# Patient Record
Sex: Male | Born: 1940
Health system: Southern US, Community
[De-identification: ages and names within clinical notes are randomized; demographics above are authoritative.]

## PROBLEM LIST (undated history)

## (undated) DIAGNOSIS — G4700 Insomnia, unspecified: Secondary | ICD-10-CM

## (undated) DIAGNOSIS — E785 Hyperlipidemia, unspecified: Secondary | ICD-10-CM

## (undated) DIAGNOSIS — J45909 Unspecified asthma, uncomplicated: Secondary | ICD-10-CM

## (undated) DIAGNOSIS — J309 Allergic rhinitis, unspecified: Secondary | ICD-10-CM

## (undated) DIAGNOSIS — F0390 Unspecified dementia without behavioral disturbance: Secondary | ICD-10-CM

## (undated) DIAGNOSIS — Z833 Family history of diabetes mellitus: Secondary | ICD-10-CM

## (undated) DIAGNOSIS — J449 Chronic obstructive pulmonary disease, unspecified: Secondary | ICD-10-CM

## (undated) DIAGNOSIS — K219 Gastro-esophageal reflux disease without esophagitis: Secondary | ICD-10-CM

## (undated) DIAGNOSIS — Z6833 Body mass index (BMI) 33.0-33.9, adult: Secondary | ICD-10-CM

## (undated) DIAGNOSIS — F32A Depression, unspecified: Secondary | ICD-10-CM

## (undated) DIAGNOSIS — K5909 Other constipation: Secondary | ICD-10-CM

## (undated) DIAGNOSIS — R600 Localized edema: Secondary | ICD-10-CM

## (undated) DIAGNOSIS — I712 Thoracic aortic aneurysm, without rupture, unspecified: Secondary | ICD-10-CM

## (undated) DIAGNOSIS — E669 Obesity, unspecified: Secondary | ICD-10-CM

## (undated) DIAGNOSIS — F320 Major depressive disorder, single episode, mild: Secondary | ICD-10-CM

## (undated) DIAGNOSIS — R079 Chest pain, unspecified: Secondary | ICD-10-CM

## (undated) DIAGNOSIS — I700 Atherosclerosis of aorta: Secondary | ICD-10-CM

## (undated) DIAGNOSIS — I10 Essential (primary) hypertension: Secondary | ICD-10-CM

## (undated) DIAGNOSIS — R413 Other amnesia: Secondary | ICD-10-CM

## (undated) DIAGNOSIS — M25561 Pain in right knee: Secondary | ICD-10-CM

## (undated) DIAGNOSIS — E559 Vitamin D deficiency, unspecified: Secondary | ICD-10-CM

## (undated) DIAGNOSIS — R5383 Other fatigue: Secondary | ICD-10-CM

## (undated) HISTORY — DX: Unspecified asthma, uncomplicated: J45.909

## (undated) HISTORY — DX: Allergic rhinitis, unspecified: J30.9

## (undated) HISTORY — DX: Family history of diabetes mellitus: Z83.3

## (undated) HISTORY — DX: Hyperlipidemia, unspecified: E78.5

## (undated) HISTORY — DX: Body mass index (BMI) 33.0-33.9, adult: Z68.33

## (undated) HISTORY — DX: Pain in right knee: M25.561

## (undated) HISTORY — DX: Atherosclerosis of aorta: I70.0

## (undated) HISTORY — PX: NO PAST SURGERIES: SHX2092

## (undated) HISTORY — DX: Other constipation: K59.09

## (undated) HISTORY — DX: Insomnia, unspecified: G47.00

## (undated) HISTORY — DX: Other amnesia: R41.3

## (undated) HISTORY — DX: Vitamin D deficiency, unspecified: E55.9

## (undated) HISTORY — DX: Major depressive disorder, single episode, mild: F32.0

## (undated) HISTORY — DX: Other fatigue: R53.83

## (undated) HISTORY — DX: Obesity, unspecified: E66.9

## (undated) HISTORY — DX: Depression, unspecified: F32.A

## (undated) HISTORY — DX: Localized edema: R60.0

## (undated) HISTORY — DX: Thoracic aortic aneurysm, without rupture, unspecified: I71.20

## (undated) HISTORY — DX: Chronic obstructive pulmonary disease, unspecified: J44.9

## (undated) HISTORY — DX: Chest pain, unspecified: R07.9

## (undated) HISTORY — DX: Thoracic aortic aneurysm, without rupture: I71.2

---

## 2014-06-11 DIAGNOSIS — H43393 Other vitreous opacities, bilateral: Secondary | ICD-10-CM | POA: Diagnosis not present

## 2014-06-11 DIAGNOSIS — H25813 Combined forms of age-related cataract, bilateral: Secondary | ICD-10-CM | POA: Diagnosis not present

## 2014-06-11 DIAGNOSIS — H11153 Pinguecula, bilateral: Secondary | ICD-10-CM | POA: Diagnosis not present

## 2014-08-23 DIAGNOSIS — H25812 Combined forms of age-related cataract, left eye: Secondary | ICD-10-CM | POA: Diagnosis not present

## 2014-08-23 DIAGNOSIS — H40013 Open angle with borderline findings, low risk, bilateral: Secondary | ICD-10-CM | POA: Diagnosis not present

## 2014-09-03 DIAGNOSIS — H259 Unspecified age-related cataract: Secondary | ICD-10-CM | POA: Diagnosis not present

## 2014-09-03 DIAGNOSIS — Z87891 Personal history of nicotine dependence: Secondary | ICD-10-CM | POA: Diagnosis not present

## 2014-09-03 DIAGNOSIS — H25812 Combined forms of age-related cataract, left eye: Secondary | ICD-10-CM | POA: Diagnosis not present

## 2014-09-03 DIAGNOSIS — H25811 Combined forms of age-related cataract, right eye: Secondary | ICD-10-CM | POA: Diagnosis not present

## 2014-10-18 DIAGNOSIS — E782 Mixed hyperlipidemia: Secondary | ICD-10-CM | POA: Diagnosis not present

## 2014-10-18 DIAGNOSIS — Z1389 Encounter for screening for other disorder: Secondary | ICD-10-CM | POA: Diagnosis not present

## 2014-10-18 DIAGNOSIS — E039 Hypothyroidism, unspecified: Secondary | ICD-10-CM | POA: Diagnosis not present

## 2014-10-18 DIAGNOSIS — Z79899 Other long term (current) drug therapy: Secondary | ICD-10-CM | POA: Diagnosis not present

## 2014-10-18 DIAGNOSIS — L409 Psoriasis, unspecified: Secondary | ICD-10-CM | POA: Diagnosis not present

## 2014-10-18 DIAGNOSIS — Z23 Encounter for immunization: Secondary | ICD-10-CM | POA: Diagnosis not present

## 2014-10-18 DIAGNOSIS — Z125 Encounter for screening for malignant neoplasm of prostate: Secondary | ICD-10-CM | POA: Diagnosis not present

## 2014-10-22 DIAGNOSIS — J449 Chronic obstructive pulmonary disease, unspecified: Secondary | ICD-10-CM | POA: Diagnosis not present

## 2014-10-22 DIAGNOSIS — Z87891 Personal history of nicotine dependence: Secondary | ICD-10-CM | POA: Diagnosis not present

## 2014-10-22 DIAGNOSIS — H25811 Combined forms of age-related cataract, right eye: Secondary | ICD-10-CM | POA: Diagnosis not present

## 2014-10-22 DIAGNOSIS — Z79899 Other long term (current) drug therapy: Secondary | ICD-10-CM | POA: Diagnosis not present

## 2014-10-30 DIAGNOSIS — M1711 Unilateral primary osteoarthritis, right knee: Secondary | ICD-10-CM | POA: Diagnosis not present

## 2014-10-30 DIAGNOSIS — Z01818 Encounter for other preprocedural examination: Secondary | ICD-10-CM | POA: Diagnosis not present

## 2014-10-30 DIAGNOSIS — Z79899 Other long term (current) drug therapy: Secondary | ICD-10-CM | POA: Diagnosis not present

## 2014-10-30 DIAGNOSIS — Z01810 Encounter for preprocedural cardiovascular examination: Secondary | ICD-10-CM | POA: Diagnosis not present

## 2014-10-30 DIAGNOSIS — M79609 Pain in unspecified limb: Secondary | ICD-10-CM | POA: Diagnosis not present

## 2014-11-21 DIAGNOSIS — Z01810 Encounter for preprocedural cardiovascular examination: Secondary | ICD-10-CM | POA: Diagnosis not present

## 2014-11-21 DIAGNOSIS — R9431 Abnormal electrocardiogram [ECG] [EKG]: Secondary | ICD-10-CM | POA: Diagnosis not present

## 2014-12-05 DIAGNOSIS — M1711 Unilateral primary osteoarthritis, right knee: Secondary | ICD-10-CM | POA: Diagnosis not present

## 2014-12-17 DIAGNOSIS — Z471 Aftercare following joint replacement surgery: Secondary | ICD-10-CM | POA: Diagnosis not present

## 2014-12-17 DIAGNOSIS — E7800 Pure hypercholesterolemia, unspecified: Secondary | ICD-10-CM | POA: Diagnosis not present

## 2014-12-17 DIAGNOSIS — E669 Obesity, unspecified: Secondary | ICD-10-CM | POA: Diagnosis not present

## 2014-12-17 DIAGNOSIS — J449 Chronic obstructive pulmonary disease, unspecified: Secondary | ICD-10-CM | POA: Diagnosis not present

## 2014-12-17 DIAGNOSIS — Z96651 Presence of right artificial knee joint: Secondary | ICD-10-CM | POA: Diagnosis not present

## 2014-12-17 DIAGNOSIS — M1990 Unspecified osteoarthritis, unspecified site: Secondary | ICD-10-CM | POA: Diagnosis not present

## 2014-12-17 DIAGNOSIS — J45909 Unspecified asthma, uncomplicated: Secondary | ICD-10-CM | POA: Diagnosis not present

## 2014-12-17 DIAGNOSIS — G479 Sleep disorder, unspecified: Secondary | ICD-10-CM | POA: Diagnosis not present

## 2014-12-17 DIAGNOSIS — M1711 Unilateral primary osteoarthritis, right knee: Secondary | ICD-10-CM | POA: Diagnosis not present

## 2014-12-17 DIAGNOSIS — J4520 Mild intermittent asthma, uncomplicated: Secondary | ICD-10-CM | POA: Diagnosis not present

## 2014-12-17 DIAGNOSIS — M25561 Pain in right knee: Secondary | ICD-10-CM | POA: Diagnosis not present

## 2014-12-17 DIAGNOSIS — G8918 Other acute postprocedural pain: Secondary | ICD-10-CM | POA: Diagnosis not present

## 2014-12-17 DIAGNOSIS — E039 Hypothyroidism, unspecified: Secondary | ICD-10-CM | POA: Diagnosis not present

## 2014-12-17 DIAGNOSIS — K219 Gastro-esophageal reflux disease without esophagitis: Secondary | ICD-10-CM | POA: Diagnosis not present

## 2014-12-17 DIAGNOSIS — I10 Essential (primary) hypertension: Secondary | ICD-10-CM | POA: Diagnosis not present

## 2014-12-21 DIAGNOSIS — J45909 Unspecified asthma, uncomplicated: Secondary | ICD-10-CM | POA: Diagnosis not present

## 2014-12-21 DIAGNOSIS — I10 Essential (primary) hypertension: Secondary | ICD-10-CM | POA: Diagnosis not present

## 2014-12-21 DIAGNOSIS — E669 Obesity, unspecified: Secondary | ICD-10-CM | POA: Diagnosis not present

## 2014-12-21 DIAGNOSIS — Z96651 Presence of right artificial knee joint: Secondary | ICD-10-CM | POA: Diagnosis not present

## 2014-12-21 DIAGNOSIS — Z471 Aftercare following joint replacement surgery: Secondary | ICD-10-CM | POA: Diagnosis not present

## 2014-12-21 DIAGNOSIS — G8918 Other acute postprocedural pain: Secondary | ICD-10-CM | POA: Diagnosis not present

## 2014-12-21 DIAGNOSIS — Z6830 Body mass index (BMI) 30.0-30.9, adult: Secondary | ICD-10-CM | POA: Diagnosis not present

## 2014-12-21 DIAGNOSIS — M150 Primary generalized (osteo)arthritis: Secondary | ICD-10-CM | POA: Diagnosis not present

## 2014-12-22 DIAGNOSIS — Z471 Aftercare following joint replacement surgery: Secondary | ICD-10-CM | POA: Diagnosis not present

## 2014-12-22 DIAGNOSIS — J45909 Unspecified asthma, uncomplicated: Secondary | ICD-10-CM | POA: Diagnosis not present

## 2014-12-22 DIAGNOSIS — E669 Obesity, unspecified: Secondary | ICD-10-CM | POA: Diagnosis not present

## 2014-12-22 DIAGNOSIS — Z96651 Presence of right artificial knee joint: Secondary | ICD-10-CM | POA: Diagnosis not present

## 2014-12-22 DIAGNOSIS — Z6830 Body mass index (BMI) 30.0-30.9, adult: Secondary | ICD-10-CM | POA: Diagnosis not present

## 2014-12-22 DIAGNOSIS — G8918 Other acute postprocedural pain: Secondary | ICD-10-CM | POA: Diagnosis not present

## 2014-12-22 DIAGNOSIS — I10 Essential (primary) hypertension: Secondary | ICD-10-CM | POA: Diagnosis not present

## 2014-12-22 DIAGNOSIS — M150 Primary generalized (osteo)arthritis: Secondary | ICD-10-CM | POA: Diagnosis not present

## 2014-12-23 DIAGNOSIS — J45909 Unspecified asthma, uncomplicated: Secondary | ICD-10-CM | POA: Diagnosis not present

## 2014-12-23 DIAGNOSIS — Z471 Aftercare following joint replacement surgery: Secondary | ICD-10-CM | POA: Diagnosis not present

## 2014-12-23 DIAGNOSIS — M150 Primary generalized (osteo)arthritis: Secondary | ICD-10-CM | POA: Diagnosis not present

## 2014-12-23 DIAGNOSIS — I10 Essential (primary) hypertension: Secondary | ICD-10-CM | POA: Diagnosis not present

## 2014-12-23 DIAGNOSIS — E669 Obesity, unspecified: Secondary | ICD-10-CM | POA: Diagnosis not present

## 2014-12-23 DIAGNOSIS — Z6830 Body mass index (BMI) 30.0-30.9, adult: Secondary | ICD-10-CM | POA: Diagnosis not present

## 2014-12-23 DIAGNOSIS — G8918 Other acute postprocedural pain: Secondary | ICD-10-CM | POA: Diagnosis not present

## 2014-12-23 DIAGNOSIS — Z96651 Presence of right artificial knee joint: Secondary | ICD-10-CM | POA: Diagnosis not present

## 2014-12-24 DIAGNOSIS — G8918 Other acute postprocedural pain: Secondary | ICD-10-CM | POA: Diagnosis not present

## 2014-12-24 DIAGNOSIS — Z6830 Body mass index (BMI) 30.0-30.9, adult: Secondary | ICD-10-CM | POA: Diagnosis not present

## 2014-12-24 DIAGNOSIS — J22 Unspecified acute lower respiratory infection: Secondary | ICD-10-CM | POA: Diagnosis not present

## 2014-12-24 DIAGNOSIS — Z471 Aftercare following joint replacement surgery: Secondary | ICD-10-CM | POA: Diagnosis not present

## 2014-12-24 DIAGNOSIS — M150 Primary generalized (osteo)arthritis: Secondary | ICD-10-CM | POA: Diagnosis not present

## 2014-12-24 DIAGNOSIS — I10 Essential (primary) hypertension: Secondary | ICD-10-CM | POA: Diagnosis not present

## 2014-12-24 DIAGNOSIS — J45909 Unspecified asthma, uncomplicated: Secondary | ICD-10-CM | POA: Diagnosis not present

## 2014-12-24 DIAGNOSIS — Z96651 Presence of right artificial knee joint: Secondary | ICD-10-CM | POA: Diagnosis not present

## 2014-12-24 DIAGNOSIS — E669 Obesity, unspecified: Secondary | ICD-10-CM | POA: Diagnosis not present

## 2014-12-25 DIAGNOSIS — M150 Primary generalized (osteo)arthritis: Secondary | ICD-10-CM | POA: Diagnosis not present

## 2014-12-25 DIAGNOSIS — Z96651 Presence of right artificial knee joint: Secondary | ICD-10-CM | POA: Diagnosis not present

## 2014-12-25 DIAGNOSIS — G8918 Other acute postprocedural pain: Secondary | ICD-10-CM | POA: Diagnosis not present

## 2014-12-25 DIAGNOSIS — J45909 Unspecified asthma, uncomplicated: Secondary | ICD-10-CM | POA: Diagnosis not present

## 2014-12-25 DIAGNOSIS — E669 Obesity, unspecified: Secondary | ICD-10-CM | POA: Diagnosis not present

## 2014-12-25 DIAGNOSIS — Z471 Aftercare following joint replacement surgery: Secondary | ICD-10-CM | POA: Diagnosis not present

## 2014-12-25 DIAGNOSIS — I10 Essential (primary) hypertension: Secondary | ICD-10-CM | POA: Diagnosis not present

## 2014-12-25 DIAGNOSIS — Z6830 Body mass index (BMI) 30.0-30.9, adult: Secondary | ICD-10-CM | POA: Diagnosis not present

## 2014-12-26 DIAGNOSIS — M150 Primary generalized (osteo)arthritis: Secondary | ICD-10-CM | POA: Diagnosis not present

## 2014-12-26 DIAGNOSIS — Z471 Aftercare following joint replacement surgery: Secondary | ICD-10-CM | POA: Diagnosis not present

## 2014-12-26 DIAGNOSIS — G8918 Other acute postprocedural pain: Secondary | ICD-10-CM | POA: Diagnosis not present

## 2014-12-26 DIAGNOSIS — I10 Essential (primary) hypertension: Secondary | ICD-10-CM | POA: Diagnosis not present

## 2014-12-26 DIAGNOSIS — Z6830 Body mass index (BMI) 30.0-30.9, adult: Secondary | ICD-10-CM | POA: Diagnosis not present

## 2014-12-26 DIAGNOSIS — Z96651 Presence of right artificial knee joint: Secondary | ICD-10-CM | POA: Diagnosis not present

## 2014-12-26 DIAGNOSIS — J45909 Unspecified asthma, uncomplicated: Secondary | ICD-10-CM | POA: Diagnosis not present

## 2014-12-26 DIAGNOSIS — E669 Obesity, unspecified: Secondary | ICD-10-CM | POA: Diagnosis not present

## 2014-12-27 DIAGNOSIS — J45909 Unspecified asthma, uncomplicated: Secondary | ICD-10-CM | POA: Diagnosis not present

## 2014-12-27 DIAGNOSIS — Z471 Aftercare following joint replacement surgery: Secondary | ICD-10-CM | POA: Diagnosis not present

## 2014-12-27 DIAGNOSIS — Z6830 Body mass index (BMI) 30.0-30.9, adult: Secondary | ICD-10-CM | POA: Diagnosis not present

## 2014-12-27 DIAGNOSIS — Z96651 Presence of right artificial knee joint: Secondary | ICD-10-CM | POA: Diagnosis not present

## 2014-12-27 DIAGNOSIS — G8918 Other acute postprocedural pain: Secondary | ICD-10-CM | POA: Diagnosis not present

## 2014-12-27 DIAGNOSIS — M150 Primary generalized (osteo)arthritis: Secondary | ICD-10-CM | POA: Diagnosis not present

## 2014-12-27 DIAGNOSIS — I10 Essential (primary) hypertension: Secondary | ICD-10-CM | POA: Diagnosis not present

## 2014-12-27 DIAGNOSIS — E669 Obesity, unspecified: Secondary | ICD-10-CM | POA: Diagnosis not present

## 2014-12-31 DIAGNOSIS — M25561 Pain in right knee: Secondary | ICD-10-CM | POA: Diagnosis not present

## 2014-12-31 DIAGNOSIS — R262 Difficulty in walking, not elsewhere classified: Secondary | ICD-10-CM | POA: Diagnosis not present

## 2014-12-31 DIAGNOSIS — Z96651 Presence of right artificial knee joint: Secondary | ICD-10-CM | POA: Diagnosis not present

## 2014-12-31 DIAGNOSIS — Z471 Aftercare following joint replacement surgery: Secondary | ICD-10-CM | POA: Diagnosis not present

## 2015-01-07 DIAGNOSIS — Z96651 Presence of right artificial knee joint: Secondary | ICD-10-CM | POA: Diagnosis not present

## 2015-01-07 DIAGNOSIS — M25561 Pain in right knee: Secondary | ICD-10-CM | POA: Diagnosis not present

## 2015-01-07 DIAGNOSIS — R262 Difficulty in walking, not elsewhere classified: Secondary | ICD-10-CM | POA: Diagnosis not present

## 2015-01-07 DIAGNOSIS — Z471 Aftercare following joint replacement surgery: Secondary | ICD-10-CM | POA: Diagnosis not present

## 2015-01-10 DIAGNOSIS — R262 Difficulty in walking, not elsewhere classified: Secondary | ICD-10-CM | POA: Diagnosis not present

## 2015-01-10 DIAGNOSIS — Z471 Aftercare following joint replacement surgery: Secondary | ICD-10-CM | POA: Diagnosis not present

## 2015-01-10 DIAGNOSIS — M25561 Pain in right knee: Secondary | ICD-10-CM | POA: Diagnosis not present

## 2015-01-10 DIAGNOSIS — Z96651 Presence of right artificial knee joint: Secondary | ICD-10-CM | POA: Diagnosis not present

## 2015-01-14 DIAGNOSIS — Z471 Aftercare following joint replacement surgery: Secondary | ICD-10-CM | POA: Diagnosis not present

## 2015-01-14 DIAGNOSIS — R262 Difficulty in walking, not elsewhere classified: Secondary | ICD-10-CM | POA: Diagnosis not present

## 2015-01-14 DIAGNOSIS — M25561 Pain in right knee: Secondary | ICD-10-CM | POA: Diagnosis not present

## 2015-01-14 DIAGNOSIS — Z96651 Presence of right artificial knee joint: Secondary | ICD-10-CM | POA: Diagnosis not present

## 2015-01-16 DIAGNOSIS — Z96651 Presence of right artificial knee joint: Secondary | ICD-10-CM | POA: Diagnosis not present

## 2015-01-16 DIAGNOSIS — M25561 Pain in right knee: Secondary | ICD-10-CM | POA: Diagnosis not present

## 2015-01-16 DIAGNOSIS — R262 Difficulty in walking, not elsewhere classified: Secondary | ICD-10-CM | POA: Diagnosis not present

## 2015-01-16 DIAGNOSIS — Z471 Aftercare following joint replacement surgery: Secondary | ICD-10-CM | POA: Diagnosis not present

## 2015-01-21 DIAGNOSIS — R262 Difficulty in walking, not elsewhere classified: Secondary | ICD-10-CM | POA: Diagnosis not present

## 2015-01-21 DIAGNOSIS — M25561 Pain in right knee: Secondary | ICD-10-CM | POA: Diagnosis not present

## 2015-01-21 DIAGNOSIS — Z96651 Presence of right artificial knee joint: Secondary | ICD-10-CM | POA: Diagnosis not present

## 2015-01-21 DIAGNOSIS — Z471 Aftercare following joint replacement surgery: Secondary | ICD-10-CM | POA: Diagnosis not present

## 2015-01-23 DIAGNOSIS — Z96651 Presence of right artificial knee joint: Secondary | ICD-10-CM | POA: Diagnosis not present

## 2015-01-23 DIAGNOSIS — Z471 Aftercare following joint replacement surgery: Secondary | ICD-10-CM | POA: Diagnosis not present

## 2015-01-23 DIAGNOSIS — R262 Difficulty in walking, not elsewhere classified: Secondary | ICD-10-CM | POA: Diagnosis not present

## 2015-01-23 DIAGNOSIS — M25561 Pain in right knee: Secondary | ICD-10-CM | POA: Diagnosis not present

## 2015-01-27 DIAGNOSIS — Z471 Aftercare following joint replacement surgery: Secondary | ICD-10-CM | POA: Diagnosis not present

## 2015-01-27 DIAGNOSIS — M25561 Pain in right knee: Secondary | ICD-10-CM | POA: Diagnosis not present

## 2015-01-27 DIAGNOSIS — M1711 Unilateral primary osteoarthritis, right knee: Secondary | ICD-10-CM | POA: Diagnosis not present

## 2015-01-27 DIAGNOSIS — Z96651 Presence of right artificial knee joint: Secondary | ICD-10-CM | POA: Diagnosis not present

## 2015-01-27 DIAGNOSIS — R262 Difficulty in walking, not elsewhere classified: Secondary | ICD-10-CM | POA: Diagnosis not present

## 2015-01-30 DIAGNOSIS — M25561 Pain in right knee: Secondary | ICD-10-CM | POA: Diagnosis not present

## 2015-01-30 DIAGNOSIS — R262 Difficulty in walking, not elsewhere classified: Secondary | ICD-10-CM | POA: Diagnosis not present

## 2015-01-30 DIAGNOSIS — Z96651 Presence of right artificial knee joint: Secondary | ICD-10-CM | POA: Diagnosis not present

## 2015-01-30 DIAGNOSIS — Z471 Aftercare following joint replacement surgery: Secondary | ICD-10-CM | POA: Diagnosis not present

## 2015-02-04 DIAGNOSIS — M25561 Pain in right knee: Secondary | ICD-10-CM | POA: Diagnosis not present

## 2015-02-04 DIAGNOSIS — Z96651 Presence of right artificial knee joint: Secondary | ICD-10-CM | POA: Diagnosis not present

## 2015-02-04 DIAGNOSIS — Z471 Aftercare following joint replacement surgery: Secondary | ICD-10-CM | POA: Diagnosis not present

## 2015-02-04 DIAGNOSIS — R262 Difficulty in walking, not elsewhere classified: Secondary | ICD-10-CM | POA: Diagnosis not present

## 2015-02-06 DIAGNOSIS — M25561 Pain in right knee: Secondary | ICD-10-CM | POA: Diagnosis not present

## 2015-02-06 DIAGNOSIS — Z96651 Presence of right artificial knee joint: Secondary | ICD-10-CM | POA: Diagnosis not present

## 2015-02-06 DIAGNOSIS — Z471 Aftercare following joint replacement surgery: Secondary | ICD-10-CM | POA: Diagnosis not present

## 2015-02-06 DIAGNOSIS — R262 Difficulty in walking, not elsewhere classified: Secondary | ICD-10-CM | POA: Diagnosis not present

## 2015-02-11 DIAGNOSIS — M25561 Pain in right knee: Secondary | ICD-10-CM | POA: Diagnosis not present

## 2015-02-11 DIAGNOSIS — Z471 Aftercare following joint replacement surgery: Secondary | ICD-10-CM | POA: Diagnosis not present

## 2015-02-11 DIAGNOSIS — Z96651 Presence of right artificial knee joint: Secondary | ICD-10-CM | POA: Diagnosis not present

## 2015-02-11 DIAGNOSIS — R262 Difficulty in walking, not elsewhere classified: Secondary | ICD-10-CM | POA: Diagnosis not present

## 2015-02-13 DIAGNOSIS — R262 Difficulty in walking, not elsewhere classified: Secondary | ICD-10-CM | POA: Diagnosis not present

## 2015-02-13 DIAGNOSIS — M25561 Pain in right knee: Secondary | ICD-10-CM | POA: Diagnosis not present

## 2015-02-13 DIAGNOSIS — Z96651 Presence of right artificial knee joint: Secondary | ICD-10-CM | POA: Diagnosis not present

## 2015-02-13 DIAGNOSIS — Z471 Aftercare following joint replacement surgery: Secondary | ICD-10-CM | POA: Diagnosis not present

## 2015-02-18 DIAGNOSIS — R262 Difficulty in walking, not elsewhere classified: Secondary | ICD-10-CM | POA: Diagnosis not present

## 2015-02-18 DIAGNOSIS — M25561 Pain in right knee: Secondary | ICD-10-CM | POA: Diagnosis not present

## 2015-02-18 DIAGNOSIS — Z96651 Presence of right artificial knee joint: Secondary | ICD-10-CM | POA: Diagnosis not present

## 2015-02-18 DIAGNOSIS — Z471 Aftercare following joint replacement surgery: Secondary | ICD-10-CM | POA: Diagnosis not present

## 2015-02-20 DIAGNOSIS — R262 Difficulty in walking, not elsewhere classified: Secondary | ICD-10-CM | POA: Diagnosis not present

## 2015-02-20 DIAGNOSIS — Z96651 Presence of right artificial knee joint: Secondary | ICD-10-CM | POA: Diagnosis not present

## 2015-02-20 DIAGNOSIS — M25561 Pain in right knee: Secondary | ICD-10-CM | POA: Diagnosis not present

## 2015-02-20 DIAGNOSIS — Z471 Aftercare following joint replacement surgery: Secondary | ICD-10-CM | POA: Diagnosis not present

## 2015-02-25 DIAGNOSIS — Z471 Aftercare following joint replacement surgery: Secondary | ICD-10-CM | POA: Diagnosis not present

## 2015-02-25 DIAGNOSIS — M25561 Pain in right knee: Secondary | ICD-10-CM | POA: Diagnosis not present

## 2015-02-25 DIAGNOSIS — R262 Difficulty in walking, not elsewhere classified: Secondary | ICD-10-CM | POA: Diagnosis not present

## 2015-02-25 DIAGNOSIS — Z96651 Presence of right artificial knee joint: Secondary | ICD-10-CM | POA: Diagnosis not present

## 2015-02-27 DIAGNOSIS — N3000 Acute cystitis without hematuria: Secondary | ICD-10-CM | POA: Diagnosis not present

## 2015-02-28 DIAGNOSIS — N3000 Acute cystitis without hematuria: Secondary | ICD-10-CM | POA: Diagnosis not present

## 2015-06-05 DIAGNOSIS — M1712 Unilateral primary osteoarthritis, left knee: Secondary | ICD-10-CM | POA: Diagnosis not present

## 2015-06-05 DIAGNOSIS — Z96651 Presence of right artificial knee joint: Secondary | ICD-10-CM | POA: Diagnosis not present

## 2015-12-08 DIAGNOSIS — Z96651 Presence of right artificial knee joint: Secondary | ICD-10-CM | POA: Diagnosis not present

## 2015-12-08 DIAGNOSIS — M1712 Unilateral primary osteoarthritis, left knee: Secondary | ICD-10-CM | POA: Diagnosis not present

## 2016-01-14 DIAGNOSIS — J22 Unspecified acute lower respiratory infection: Secondary | ICD-10-CM | POA: Diagnosis not present

## 2016-01-14 DIAGNOSIS — Z0000 Encounter for general adult medical examination without abnormal findings: Secondary | ICD-10-CM | POA: Diagnosis not present

## 2016-01-14 DIAGNOSIS — Z9181 History of falling: Secondary | ICD-10-CM | POA: Diagnosis not present

## 2016-01-14 DIAGNOSIS — Z1389 Encounter for screening for other disorder: Secondary | ICD-10-CM | POA: Diagnosis not present

## 2016-01-14 DIAGNOSIS — N529 Male erectile dysfunction, unspecified: Secondary | ICD-10-CM | POA: Diagnosis not present

## 2016-01-14 DIAGNOSIS — R300 Dysuria: Secondary | ICD-10-CM | POA: Diagnosis not present

## 2016-03-18 DIAGNOSIS — Z961 Presence of intraocular lens: Secondary | ICD-10-CM | POA: Diagnosis not present

## 2016-05-12 DIAGNOSIS — Z96651 Presence of right artificial knee joint: Secondary | ICD-10-CM | POA: Diagnosis not present

## 2016-05-18 DIAGNOSIS — M25561 Pain in right knee: Secondary | ICD-10-CM | POA: Diagnosis not present

## 2016-05-18 DIAGNOSIS — Z96651 Presence of right artificial knee joint: Secondary | ICD-10-CM | POA: Diagnosis not present

## 2016-05-19 DIAGNOSIS — Z96651 Presence of right artificial knee joint: Secondary | ICD-10-CM | POA: Diagnosis not present

## 2016-05-19 DIAGNOSIS — M545 Low back pain: Secondary | ICD-10-CM | POA: Diagnosis not present

## 2016-05-19 DIAGNOSIS — M47812 Spondylosis without myelopathy or radiculopathy, cervical region: Secondary | ICD-10-CM | POA: Diagnosis not present

## 2016-05-24 DIAGNOSIS — Z6832 Body mass index (BMI) 32.0-32.9, adult: Secondary | ICD-10-CM | POA: Diagnosis not present

## 2016-05-24 DIAGNOSIS — R0781 Pleurodynia: Secondary | ICD-10-CM | POA: Diagnosis not present

## 2016-06-16 DIAGNOSIS — M419 Scoliosis, unspecified: Secondary | ICD-10-CM | POA: Diagnosis not present

## 2016-08-02 DIAGNOSIS — K5900 Constipation, unspecified: Secondary | ICD-10-CM | POA: Diagnosis not present

## 2016-08-02 DIAGNOSIS — K5730 Diverticulosis of large intestine without perforation or abscess without bleeding: Secondary | ICD-10-CM | POA: Diagnosis not present

## 2016-08-02 DIAGNOSIS — Z01818 Encounter for other preprocedural examination: Secondary | ICD-10-CM | POA: Diagnosis not present

## 2016-09-03 DIAGNOSIS — Z1211 Encounter for screening for malignant neoplasm of colon: Secondary | ICD-10-CM | POA: Diagnosis not present

## 2016-09-03 DIAGNOSIS — M1990 Unspecified osteoarthritis, unspecified site: Secondary | ICD-10-CM | POA: Diagnosis not present

## 2016-09-03 DIAGNOSIS — K648 Other hemorrhoids: Secondary | ICD-10-CM | POA: Diagnosis not present

## 2016-09-03 DIAGNOSIS — E079 Disorder of thyroid, unspecified: Secondary | ICD-10-CM | POA: Diagnosis not present

## 2016-09-03 DIAGNOSIS — Z86010 Personal history of colonic polyps: Secondary | ICD-10-CM | POA: Diagnosis not present

## 2016-09-03 DIAGNOSIS — Z800 Family history of malignant neoplasm of digestive organs: Secondary | ICD-10-CM | POA: Diagnosis not present

## 2016-09-03 DIAGNOSIS — K5730 Diverticulosis of large intestine without perforation or abscess without bleeding: Secondary | ICD-10-CM | POA: Diagnosis not present

## 2016-09-03 DIAGNOSIS — Z79899 Other long term (current) drug therapy: Secondary | ICD-10-CM | POA: Diagnosis not present

## 2016-10-07 DIAGNOSIS — E785 Hyperlipidemia, unspecified: Secondary | ICD-10-CM | POA: Diagnosis not present

## 2016-10-07 DIAGNOSIS — H9319 Tinnitus, unspecified ear: Secondary | ICD-10-CM | POA: Diagnosis not present

## 2016-10-07 DIAGNOSIS — J441 Chronic obstructive pulmonary disease with (acute) exacerbation: Secondary | ICD-10-CM | POA: Diagnosis not present

## 2016-10-07 DIAGNOSIS — Z6833 Body mass index (BMI) 33.0-33.9, adult: Secondary | ICD-10-CM | POA: Diagnosis not present

## 2016-10-07 DIAGNOSIS — E039 Hypothyroidism, unspecified: Secondary | ICD-10-CM | POA: Diagnosis not present

## 2016-10-07 DIAGNOSIS — G4700 Insomnia, unspecified: Secondary | ICD-10-CM | POA: Diagnosis not present

## 2016-10-25 DIAGNOSIS — R062 Wheezing: Secondary | ICD-10-CM | POA: Diagnosis not present

## 2016-10-25 DIAGNOSIS — Z6833 Body mass index (BMI) 33.0-33.9, adult: Secondary | ICD-10-CM | POA: Diagnosis not present

## 2016-10-25 DIAGNOSIS — G4700 Insomnia, unspecified: Secondary | ICD-10-CM | POA: Diagnosis not present

## 2016-10-25 DIAGNOSIS — Z125 Encounter for screening for malignant neoplasm of prostate: Secondary | ICD-10-CM | POA: Diagnosis not present

## 2016-11-12 DIAGNOSIS — Z23 Encounter for immunization: Secondary | ICD-10-CM | POA: Diagnosis not present

## 2016-12-23 DIAGNOSIS — H43393 Other vitreous opacities, bilateral: Secondary | ICD-10-CM | POA: Diagnosis not present

## 2016-12-23 DIAGNOSIS — Z961 Presence of intraocular lens: Secondary | ICD-10-CM | POA: Diagnosis not present

## 2016-12-23 DIAGNOSIS — Z0100 Encounter for examination of eyes and vision without abnormal findings: Secondary | ICD-10-CM | POA: Diagnosis not present

## 2017-01-04 DIAGNOSIS — E039 Hypothyroidism, unspecified: Secondary | ICD-10-CM | POA: Diagnosis not present

## 2017-01-04 DIAGNOSIS — J45909 Unspecified asthma, uncomplicated: Secondary | ICD-10-CM | POA: Diagnosis not present

## 2017-01-04 DIAGNOSIS — R600 Localized edema: Secondary | ICD-10-CM | POA: Diagnosis not present

## 2017-01-04 DIAGNOSIS — Z6833 Body mass index (BMI) 33.0-33.9, adult: Secondary | ICD-10-CM | POA: Diagnosis not present

## 2017-01-04 DIAGNOSIS — J309 Allergic rhinitis, unspecified: Secondary | ICD-10-CM | POA: Diagnosis not present

## 2017-01-04 DIAGNOSIS — E559 Vitamin D deficiency, unspecified: Secondary | ICD-10-CM | POA: Diagnosis not present

## 2017-01-04 DIAGNOSIS — F320 Major depressive disorder, single episode, mild: Secondary | ICD-10-CM | POA: Diagnosis not present

## 2017-01-04 DIAGNOSIS — Z79899 Other long term (current) drug therapy: Secondary | ICD-10-CM | POA: Diagnosis not present

## 2017-01-04 DIAGNOSIS — K219 Gastro-esophageal reflux disease without esophagitis: Secondary | ICD-10-CM | POA: Diagnosis not present

## 2017-01-05 DIAGNOSIS — J45909 Unspecified asthma, uncomplicated: Secondary | ICD-10-CM | POA: Diagnosis not present

## 2017-01-05 DIAGNOSIS — R05 Cough: Secondary | ICD-10-CM | POA: Diagnosis not present

## 2017-01-05 DIAGNOSIS — R079 Chest pain, unspecified: Secondary | ICD-10-CM | POA: Diagnosis not present

## 2017-01-10 DIAGNOSIS — R911 Solitary pulmonary nodule: Secondary | ICD-10-CM | POA: Diagnosis not present

## 2017-01-10 DIAGNOSIS — R918 Other nonspecific abnormal finding of lung field: Secondary | ICD-10-CM | POA: Diagnosis not present

## 2017-01-18 DIAGNOSIS — E559 Vitamin D deficiency, unspecified: Secondary | ICD-10-CM | POA: Diagnosis not present

## 2017-01-18 DIAGNOSIS — Z1331 Encounter for screening for depression: Secondary | ICD-10-CM | POA: Diagnosis not present

## 2017-01-18 DIAGNOSIS — K219 Gastro-esophageal reflux disease without esophagitis: Secondary | ICD-10-CM | POA: Diagnosis not present

## 2017-01-18 DIAGNOSIS — R600 Localized edema: Secondary | ICD-10-CM | POA: Diagnosis not present

## 2017-01-18 DIAGNOSIS — Z6833 Body mass index (BMI) 33.0-33.9, adult: Secondary | ICD-10-CM | POA: Diagnosis not present

## 2017-01-18 DIAGNOSIS — Z9181 History of falling: Secondary | ICD-10-CM | POA: Diagnosis not present

## 2017-01-18 DIAGNOSIS — F329 Major depressive disorder, single episode, unspecified: Secondary | ICD-10-CM | POA: Diagnosis not present

## 2017-01-18 DIAGNOSIS — R9389 Abnormal findings on diagnostic imaging of other specified body structures: Secondary | ICD-10-CM | POA: Diagnosis not present

## 2017-01-18 DIAGNOSIS — E039 Hypothyroidism, unspecified: Secondary | ICD-10-CM | POA: Diagnosis not present

## 2017-01-19 DIAGNOSIS — I700 Atherosclerosis of aorta: Secondary | ICD-10-CM | POA: Diagnosis not present

## 2017-01-19 DIAGNOSIS — R739 Hyperglycemia, unspecified: Secondary | ICD-10-CM | POA: Diagnosis not present

## 2017-01-21 DIAGNOSIS — Z6833 Body mass index (BMI) 33.0-33.9, adult: Secondary | ICD-10-CM | POA: Diagnosis not present

## 2017-01-21 DIAGNOSIS — R3989 Other symptoms and signs involving the genitourinary system: Secondary | ICD-10-CM | POA: Diagnosis not present

## 2017-01-21 DIAGNOSIS — J45909 Unspecified asthma, uncomplicated: Secondary | ICD-10-CM | POA: Diagnosis not present

## 2017-02-15 DIAGNOSIS — J45909 Unspecified asthma, uncomplicated: Secondary | ICD-10-CM | POA: Diagnosis not present

## 2017-02-21 DIAGNOSIS — J45909 Unspecified asthma, uncomplicated: Secondary | ICD-10-CM | POA: Diagnosis not present

## 2017-03-10 DIAGNOSIS — J45909 Unspecified asthma, uncomplicated: Secondary | ICD-10-CM | POA: Diagnosis not present

## 2017-03-14 DIAGNOSIS — E039 Hypothyroidism, unspecified: Secondary | ICD-10-CM | POA: Diagnosis not present

## 2017-03-14 DIAGNOSIS — Z6833 Body mass index (BMI) 33.0-33.9, adult: Secondary | ICD-10-CM | POA: Diagnosis not present

## 2017-03-14 DIAGNOSIS — Z79899 Other long term (current) drug therapy: Secondary | ICD-10-CM | POA: Diagnosis not present

## 2017-03-14 DIAGNOSIS — F320 Major depressive disorder, single episode, mild: Secondary | ICD-10-CM | POA: Diagnosis not present

## 2017-03-14 DIAGNOSIS — E559 Vitamin D deficiency, unspecified: Secondary | ICD-10-CM | POA: Diagnosis not present

## 2017-03-14 DIAGNOSIS — G4700 Insomnia, unspecified: Secondary | ICD-10-CM | POA: Diagnosis not present

## 2017-03-14 DIAGNOSIS — E669 Obesity, unspecified: Secondary | ICD-10-CM | POA: Diagnosis not present

## 2017-03-14 DIAGNOSIS — I700 Atherosclerosis of aorta: Secondary | ICD-10-CM | POA: Diagnosis not present

## 2017-03-14 DIAGNOSIS — R600 Localized edema: Secondary | ICD-10-CM | POA: Diagnosis not present

## 2017-03-24 DIAGNOSIS — J45909 Unspecified asthma, uncomplicated: Secondary | ICD-10-CM | POA: Diagnosis not present

## 2017-04-21 DIAGNOSIS — J45909 Unspecified asthma, uncomplicated: Secondary | ICD-10-CM | POA: Diagnosis not present

## 2017-05-22 DIAGNOSIS — J45909 Unspecified asthma, uncomplicated: Secondary | ICD-10-CM | POA: Diagnosis not present

## 2017-05-26 DIAGNOSIS — R509 Fever, unspecified: Secondary | ICD-10-CM | POA: Diagnosis not present

## 2017-05-26 DIAGNOSIS — E669 Obesity, unspecified: Secondary | ICD-10-CM | POA: Diagnosis not present

## 2017-05-26 DIAGNOSIS — J019 Acute sinusitis, unspecified: Secondary | ICD-10-CM | POA: Diagnosis not present

## 2017-05-26 DIAGNOSIS — J209 Acute bronchitis, unspecified: Secondary | ICD-10-CM | POA: Diagnosis not present

## 2017-05-26 DIAGNOSIS — Z6833 Body mass index (BMI) 33.0-33.9, adult: Secondary | ICD-10-CM | POA: Diagnosis not present

## 2017-05-26 DIAGNOSIS — J309 Allergic rhinitis, unspecified: Secondary | ICD-10-CM | POA: Diagnosis not present

## 2017-06-13 DIAGNOSIS — E039 Hypothyroidism, unspecified: Secondary | ICD-10-CM | POA: Diagnosis not present

## 2017-06-13 DIAGNOSIS — Z6834 Body mass index (BMI) 34.0-34.9, adult: Secondary | ICD-10-CM | POA: Diagnosis not present

## 2017-06-13 DIAGNOSIS — F320 Major depressive disorder, single episode, mild: Secondary | ICD-10-CM | POA: Diagnosis not present

## 2017-06-13 DIAGNOSIS — E559 Vitamin D deficiency, unspecified: Secondary | ICD-10-CM | POA: Diagnosis not present

## 2017-06-13 DIAGNOSIS — E669 Obesity, unspecified: Secondary | ICD-10-CM | POA: Diagnosis not present

## 2017-06-13 DIAGNOSIS — J309 Allergic rhinitis, unspecified: Secondary | ICD-10-CM | POA: Diagnosis not present

## 2017-06-13 DIAGNOSIS — K219 Gastro-esophageal reflux disease without esophagitis: Secondary | ICD-10-CM | POA: Diagnosis not present

## 2017-06-13 DIAGNOSIS — J45909 Unspecified asthma, uncomplicated: Secondary | ICD-10-CM | POA: Diagnosis not present

## 2017-06-21 DIAGNOSIS — J45909 Unspecified asthma, uncomplicated: Secondary | ICD-10-CM | POA: Diagnosis not present

## 2017-07-15 DIAGNOSIS — H9313 Tinnitus, bilateral: Secondary | ICD-10-CM | POA: Diagnosis not present

## 2017-07-15 DIAGNOSIS — Z77122 Contact with and (suspected) exposure to noise: Secondary | ICD-10-CM | POA: Diagnosis not present

## 2017-07-15 DIAGNOSIS — R490 Dysphonia: Secondary | ICD-10-CM | POA: Diagnosis not present

## 2017-07-15 DIAGNOSIS — J342 Deviated nasal septum: Secondary | ICD-10-CM | POA: Diagnosis not present

## 2017-07-15 DIAGNOSIS — J392 Other diseases of pharynx: Secondary | ICD-10-CM | POA: Diagnosis not present

## 2017-07-15 DIAGNOSIS — J387 Other diseases of larynx: Secondary | ICD-10-CM | POA: Diagnosis not present

## 2017-07-15 DIAGNOSIS — Z87891 Personal history of nicotine dependence: Secondary | ICD-10-CM | POA: Diagnosis not present

## 2017-07-15 DIAGNOSIS — H9193 Unspecified hearing loss, bilateral: Secondary | ICD-10-CM | POA: Diagnosis not present

## 2017-07-22 DIAGNOSIS — J45909 Unspecified asthma, uncomplicated: Secondary | ICD-10-CM | POA: Diagnosis not present

## 2017-08-01 DIAGNOSIS — H9313 Tinnitus, bilateral: Secondary | ICD-10-CM | POA: Diagnosis not present

## 2017-08-01 DIAGNOSIS — J342 Deviated nasal septum: Secondary | ICD-10-CM | POA: Diagnosis not present

## 2017-08-01 DIAGNOSIS — H903 Sensorineural hearing loss, bilateral: Secondary | ICD-10-CM | POA: Diagnosis not present

## 2017-08-25 DIAGNOSIS — J45909 Unspecified asthma, uncomplicated: Secondary | ICD-10-CM | POA: Diagnosis not present

## 2017-09-21 DIAGNOSIS — J45909 Unspecified asthma, uncomplicated: Secondary | ICD-10-CM | POA: Diagnosis not present

## 2017-10-17 DIAGNOSIS — Z1339 Encounter for screening examination for other mental health and behavioral disorders: Secondary | ICD-10-CM | POA: Diagnosis not present

## 2017-10-17 DIAGNOSIS — J45909 Unspecified asthma, uncomplicated: Secondary | ICD-10-CM | POA: Diagnosis not present

## 2017-10-17 DIAGNOSIS — Z6834 Body mass index (BMI) 34.0-34.9, adult: Secondary | ICD-10-CM | POA: Diagnosis not present

## 2017-10-17 DIAGNOSIS — R600 Localized edema: Secondary | ICD-10-CM | POA: Diagnosis not present

## 2017-10-17 DIAGNOSIS — E669 Obesity, unspecified: Secondary | ICD-10-CM | POA: Diagnosis not present

## 2017-10-17 DIAGNOSIS — E559 Vitamin D deficiency, unspecified: Secondary | ICD-10-CM | POA: Diagnosis not present

## 2017-10-17 DIAGNOSIS — K219 Gastro-esophageal reflux disease without esophagitis: Secondary | ICD-10-CM | POA: Diagnosis not present

## 2017-10-17 DIAGNOSIS — E039 Hypothyroidism, unspecified: Secondary | ICD-10-CM | POA: Diagnosis not present

## 2017-10-17 DIAGNOSIS — Z79899 Other long term (current) drug therapy: Secondary | ICD-10-CM | POA: Diagnosis not present

## 2017-10-17 DIAGNOSIS — J309 Allergic rhinitis, unspecified: Secondary | ICD-10-CM | POA: Diagnosis not present

## 2017-10-22 DIAGNOSIS — J45909 Unspecified asthma, uncomplicated: Secondary | ICD-10-CM | POA: Diagnosis not present

## 2017-11-21 DIAGNOSIS — J45909 Unspecified asthma, uncomplicated: Secondary | ICD-10-CM | POA: Diagnosis not present

## 2017-12-22 DIAGNOSIS — J45909 Unspecified asthma, uncomplicated: Secondary | ICD-10-CM | POA: Diagnosis not present

## 2018-01-16 DIAGNOSIS — F320 Major depressive disorder, single episode, mild: Secondary | ICD-10-CM | POA: Diagnosis not present

## 2018-01-16 DIAGNOSIS — J309 Allergic rhinitis, unspecified: Secondary | ICD-10-CM | POA: Diagnosis not present

## 2018-01-16 DIAGNOSIS — Z79899 Other long term (current) drug therapy: Secondary | ICD-10-CM | POA: Diagnosis not present

## 2018-01-16 DIAGNOSIS — Z139 Encounter for screening, unspecified: Secondary | ICD-10-CM | POA: Diagnosis not present

## 2018-01-16 DIAGNOSIS — E039 Hypothyroidism, unspecified: Secondary | ICD-10-CM | POA: Diagnosis not present

## 2018-01-16 DIAGNOSIS — J45909 Unspecified asthma, uncomplicated: Secondary | ICD-10-CM | POA: Diagnosis not present

## 2018-01-16 DIAGNOSIS — Z6834 Body mass index (BMI) 34.0-34.9, adult: Secondary | ICD-10-CM | POA: Diagnosis not present

## 2018-01-16 DIAGNOSIS — Z23 Encounter for immunization: Secondary | ICD-10-CM | POA: Diagnosis not present

## 2018-01-16 DIAGNOSIS — E669 Obesity, unspecified: Secondary | ICD-10-CM | POA: Diagnosis not present

## 2018-01-16 DIAGNOSIS — M1990 Unspecified osteoarthritis, unspecified site: Secondary | ICD-10-CM | POA: Diagnosis not present

## 2018-01-21 DIAGNOSIS — J45909 Unspecified asthma, uncomplicated: Secondary | ICD-10-CM | POA: Diagnosis not present

## 2018-02-21 DIAGNOSIS — J45909 Unspecified asthma, uncomplicated: Secondary | ICD-10-CM | POA: Diagnosis not present

## 2018-03-01 DIAGNOSIS — J45909 Unspecified asthma, uncomplicated: Secondary | ICD-10-CM | POA: Diagnosis not present

## 2018-04-18 DIAGNOSIS — E039 Hypothyroidism, unspecified: Secondary | ICD-10-CM | POA: Diagnosis not present

## 2018-04-18 DIAGNOSIS — E559 Vitamin D deficiency, unspecified: Secondary | ICD-10-CM | POA: Diagnosis not present

## 2018-04-18 DIAGNOSIS — J309 Allergic rhinitis, unspecified: Secondary | ICD-10-CM | POA: Diagnosis not present

## 2018-04-18 DIAGNOSIS — R600 Localized edema: Secondary | ICD-10-CM | POA: Diagnosis not present

## 2018-04-18 DIAGNOSIS — R413 Other amnesia: Secondary | ICD-10-CM | POA: Diagnosis not present

## 2018-04-18 DIAGNOSIS — Z1331 Encounter for screening for depression: Secondary | ICD-10-CM | POA: Diagnosis not present

## 2018-04-18 DIAGNOSIS — Z6834 Body mass index (BMI) 34.0-34.9, adult: Secondary | ICD-10-CM | POA: Diagnosis not present

## 2018-04-18 DIAGNOSIS — F320 Major depressive disorder, single episode, mild: Secondary | ICD-10-CM | POA: Diagnosis not present

## 2018-04-18 DIAGNOSIS — Z79899 Other long term (current) drug therapy: Secondary | ICD-10-CM | POA: Diagnosis not present

## 2018-04-18 DIAGNOSIS — M1712 Unilateral primary osteoarthritis, left knee: Secondary | ICD-10-CM | POA: Diagnosis not present

## 2018-04-18 DIAGNOSIS — Z9181 History of falling: Secondary | ICD-10-CM | POA: Diagnosis not present

## 2018-04-18 DIAGNOSIS — E785 Hyperlipidemia, unspecified: Secondary | ICD-10-CM | POA: Diagnosis not present

## 2018-08-30 DIAGNOSIS — M25561 Pain in right knee: Secondary | ICD-10-CM | POA: Diagnosis not present

## 2018-08-30 DIAGNOSIS — Z79899 Other long term (current) drug therapy: Secondary | ICD-10-CM | POA: Diagnosis not present

## 2018-08-30 DIAGNOSIS — J309 Allergic rhinitis, unspecified: Secondary | ICD-10-CM | POA: Diagnosis not present

## 2018-08-30 DIAGNOSIS — E039 Hypothyroidism, unspecified: Secondary | ICD-10-CM | POA: Diagnosis not present

## 2018-08-30 DIAGNOSIS — R600 Localized edema: Secondary | ICD-10-CM | POA: Diagnosis not present

## 2018-08-30 DIAGNOSIS — E559 Vitamin D deficiency, unspecified: Secondary | ICD-10-CM | POA: Diagnosis not present

## 2018-08-30 DIAGNOSIS — J45909 Unspecified asthma, uncomplicated: Secondary | ICD-10-CM | POA: Diagnosis not present

## 2018-08-31 DIAGNOSIS — E559 Vitamin D deficiency, unspecified: Secondary | ICD-10-CM | POA: Diagnosis not present

## 2018-08-31 DIAGNOSIS — Z79899 Other long term (current) drug therapy: Secondary | ICD-10-CM | POA: Diagnosis not present

## 2018-08-31 DIAGNOSIS — E039 Hypothyroidism, unspecified: Secondary | ICD-10-CM | POA: Diagnosis not present

## 2018-08-31 DIAGNOSIS — M25561 Pain in right knee: Secondary | ICD-10-CM | POA: Diagnosis not present

## 2018-10-04 DIAGNOSIS — K5900 Constipation, unspecified: Secondary | ICD-10-CM | POA: Diagnosis not present

## 2018-10-04 DIAGNOSIS — K5730 Diverticulosis of large intestine without perforation or abscess without bleeding: Secondary | ICD-10-CM | POA: Diagnosis not present

## 2018-11-06 DIAGNOSIS — R1310 Dysphagia, unspecified: Secondary | ICD-10-CM | POA: Diagnosis not present

## 2018-11-06 DIAGNOSIS — K5900 Constipation, unspecified: Secondary | ICD-10-CM | POA: Diagnosis not present

## 2018-11-06 DIAGNOSIS — K5730 Diverticulosis of large intestine without perforation or abscess without bleeding: Secondary | ICD-10-CM | POA: Diagnosis not present

## 2018-11-06 DIAGNOSIS — K219 Gastro-esophageal reflux disease without esophagitis: Secondary | ICD-10-CM | POA: Diagnosis not present

## 2018-11-16 DIAGNOSIS — D126 Benign neoplasm of colon, unspecified: Secondary | ICD-10-CM | POA: Diagnosis not present

## 2018-11-16 DIAGNOSIS — K635 Polyp of colon: Secondary | ICD-10-CM | POA: Diagnosis not present

## 2018-11-16 DIAGNOSIS — K921 Melena: Secondary | ICD-10-CM | POA: Diagnosis not present

## 2018-11-16 DIAGNOSIS — R1310 Dysphagia, unspecified: Secondary | ICD-10-CM | POA: Diagnosis not present

## 2018-11-16 DIAGNOSIS — K5730 Diverticulosis of large intestine without perforation or abscess without bleeding: Secondary | ICD-10-CM | POA: Diagnosis not present

## 2018-11-16 DIAGNOSIS — K648 Other hemorrhoids: Secondary | ICD-10-CM | POA: Diagnosis not present

## 2018-11-16 DIAGNOSIS — K5750 Diverticulosis of both small and large intestine without perforation or abscess without bleeding: Secondary | ICD-10-CM | POA: Diagnosis not present

## 2018-11-16 DIAGNOSIS — D124 Benign neoplasm of descending colon: Secondary | ICD-10-CM | POA: Diagnosis not present

## 2018-11-16 DIAGNOSIS — Z800 Family history of malignant neoplasm of digestive organs: Secondary | ICD-10-CM | POA: Diagnosis not present

## 2018-11-16 DIAGNOSIS — K449 Diaphragmatic hernia without obstruction or gangrene: Secondary | ICD-10-CM | POA: Diagnosis not present

## 2018-11-16 DIAGNOSIS — E7800 Pure hypercholesterolemia, unspecified: Secondary | ICD-10-CM | POA: Diagnosis not present

## 2018-11-16 DIAGNOSIS — K6289 Other specified diseases of anus and rectum: Secondary | ICD-10-CM | POA: Diagnosis not present

## 2018-11-16 DIAGNOSIS — K219 Gastro-esophageal reflux disease without esophagitis: Secondary | ICD-10-CM | POA: Diagnosis not present

## 2018-11-16 DIAGNOSIS — D122 Benign neoplasm of ascending colon: Secondary | ICD-10-CM | POA: Diagnosis not present

## 2018-11-16 DIAGNOSIS — K222 Esophageal obstruction: Secondary | ICD-10-CM | POA: Diagnosis not present

## 2018-12-04 DIAGNOSIS — E7800 Pure hypercholesterolemia, unspecified: Secondary | ICD-10-CM | POA: Diagnosis not present

## 2018-12-04 DIAGNOSIS — R079 Chest pain, unspecified: Secondary | ICD-10-CM | POA: Diagnosis not present

## 2018-12-04 DIAGNOSIS — K219 Gastro-esophageal reflux disease without esophagitis: Secondary | ICD-10-CM | POA: Diagnosis not present

## 2018-12-04 DIAGNOSIS — R600 Localized edema: Secondary | ICD-10-CM | POA: Diagnosis not present

## 2018-12-04 DIAGNOSIS — I10 Essential (primary) hypertension: Secondary | ICD-10-CM | POA: Diagnosis not present

## 2018-12-04 DIAGNOSIS — J449 Chronic obstructive pulmonary disease, unspecified: Secondary | ICD-10-CM | POA: Diagnosis not present

## 2018-12-06 DIAGNOSIS — E785 Hyperlipidemia, unspecified: Secondary | ICD-10-CM | POA: Diagnosis not present

## 2018-12-06 DIAGNOSIS — Z1331 Encounter for screening for depression: Secondary | ICD-10-CM | POA: Diagnosis not present

## 2018-12-06 DIAGNOSIS — Z0000 Encounter for general adult medical examination without abnormal findings: Secondary | ICD-10-CM | POA: Diagnosis not present

## 2018-12-06 DIAGNOSIS — Z125 Encounter for screening for malignant neoplasm of prostate: Secondary | ICD-10-CM | POA: Diagnosis not present

## 2018-12-06 DIAGNOSIS — Z6833 Body mass index (BMI) 33.0-33.9, adult: Secondary | ICD-10-CM | POA: Diagnosis not present

## 2018-12-06 DIAGNOSIS — Z9181 History of falling: Secondary | ICD-10-CM | POA: Diagnosis not present

## 2018-12-13 DIAGNOSIS — R079 Chest pain, unspecified: Secondary | ICD-10-CM | POA: Diagnosis not present

## 2018-12-13 DIAGNOSIS — E039 Hypothyroidism, unspecified: Secondary | ICD-10-CM | POA: Diagnosis not present

## 2018-12-13 DIAGNOSIS — M25561 Pain in right knee: Secondary | ICD-10-CM | POA: Diagnosis not present

## 2018-12-13 DIAGNOSIS — Z79899 Other long term (current) drug therapy: Secondary | ICD-10-CM | POA: Diagnosis not present

## 2018-12-13 DIAGNOSIS — Z09 Encounter for follow-up examination after completed treatment for conditions other than malignant neoplasm: Secondary | ICD-10-CM | POA: Diagnosis not present

## 2018-12-13 DIAGNOSIS — E559 Vitamin D deficiency, unspecified: Secondary | ICD-10-CM | POA: Diagnosis not present

## 2018-12-18 DIAGNOSIS — D126 Benign neoplasm of colon, unspecified: Secondary | ICD-10-CM | POA: Diagnosis not present

## 2018-12-18 DIAGNOSIS — K5730 Diverticulosis of large intestine without perforation or abscess without bleeding: Secondary | ICD-10-CM | POA: Diagnosis not present

## 2018-12-18 DIAGNOSIS — K921 Melena: Secondary | ICD-10-CM | POA: Diagnosis not present

## 2018-12-18 DIAGNOSIS — K648 Other hemorrhoids: Secondary | ICD-10-CM | POA: Diagnosis not present

## 2018-12-20 DIAGNOSIS — R1031 Right lower quadrant pain: Secondary | ICD-10-CM | POA: Diagnosis not present

## 2018-12-20 DIAGNOSIS — N401 Enlarged prostate with lower urinary tract symptoms: Secondary | ICD-10-CM | POA: Diagnosis not present

## 2018-12-20 DIAGNOSIS — R1011 Right upper quadrant pain: Secondary | ICD-10-CM | POA: Diagnosis not present

## 2018-12-20 DIAGNOSIS — R109 Unspecified abdominal pain: Secondary | ICD-10-CM | POA: Diagnosis not present

## 2018-12-21 DIAGNOSIS — K219 Gastro-esophageal reflux disease without esophagitis: Secondary | ICD-10-CM | POA: Diagnosis not present

## 2018-12-21 DIAGNOSIS — R1011 Right upper quadrant pain: Secondary | ICD-10-CM | POA: Diagnosis not present

## 2018-12-29 DIAGNOSIS — R1013 Epigastric pain: Secondary | ICD-10-CM | POA: Diagnosis not present

## 2018-12-29 DIAGNOSIS — R1011 Right upper quadrant pain: Secondary | ICD-10-CM | POA: Diagnosis not present

## 2019-01-08 ENCOUNTER — Ambulatory Visit: Payer: Self-pay | Admitting: Cardiology

## 2019-01-15 DIAGNOSIS — H02225 Mechanical lagophthalmos left lower eyelid: Secondary | ICD-10-CM | POA: Diagnosis not present

## 2019-01-29 DIAGNOSIS — R413 Other amnesia: Secondary | ICD-10-CM | POA: Diagnosis not present

## 2019-01-29 DIAGNOSIS — R600 Localized edema: Secondary | ICD-10-CM | POA: Diagnosis not present

## 2019-01-29 DIAGNOSIS — K219 Gastro-esophageal reflux disease without esophagitis: Secondary | ICD-10-CM | POA: Diagnosis not present

## 2019-01-29 DIAGNOSIS — M25512 Pain in left shoulder: Secondary | ICD-10-CM | POA: Diagnosis not present

## 2019-01-29 DIAGNOSIS — M19012 Primary osteoarthritis, left shoulder: Secondary | ICD-10-CM | POA: Diagnosis not present

## 2019-01-29 DIAGNOSIS — E559 Vitamin D deficiency, unspecified: Secondary | ICD-10-CM | POA: Diagnosis not present

## 2019-02-01 DIAGNOSIS — M75122 Complete rotator cuff tear or rupture of left shoulder, not specified as traumatic: Secondary | ICD-10-CM | POA: Diagnosis not present

## 2019-02-27 DIAGNOSIS — Z20828 Contact with and (suspected) exposure to other viral communicable diseases: Secondary | ICD-10-CM | POA: Diagnosis not present

## 2019-03-02 DIAGNOSIS — Z87891 Personal history of nicotine dependence: Secondary | ICD-10-CM | POA: Diagnosis not present

## 2019-03-02 DIAGNOSIS — I10 Essential (primary) hypertension: Secondary | ICD-10-CM | POA: Diagnosis not present

## 2019-03-02 DIAGNOSIS — F0390 Unspecified dementia without behavioral disturbance: Secondary | ICD-10-CM | POA: Diagnosis not present

## 2019-03-02 DIAGNOSIS — R0602 Shortness of breath: Secondary | ICD-10-CM | POA: Diagnosis not present

## 2019-03-02 DIAGNOSIS — U071 COVID-19: Secondary | ICD-10-CM | POA: Diagnosis not present

## 2019-03-02 DIAGNOSIS — E7800 Pure hypercholesterolemia, unspecified: Secondary | ICD-10-CM | POA: Diagnosis not present

## 2019-03-02 DIAGNOSIS — Z79899 Other long term (current) drug therapy: Secondary | ICD-10-CM | POA: Diagnosis not present

## 2019-03-03 DIAGNOSIS — R0902 Hypoxemia: Secondary | ICD-10-CM | POA: Diagnosis not present

## 2019-03-03 DIAGNOSIS — U071 COVID-19: Secondary | ICD-10-CM | POA: Diagnosis not present

## 2019-03-03 DIAGNOSIS — R0602 Shortness of breath: Secondary | ICD-10-CM | POA: Diagnosis not present

## 2019-03-04 ENCOUNTER — Inpatient Hospital Stay (HOSPITAL_COMMUNITY)
Admission: EM | Admit: 2019-03-04 | Discharge: 2019-03-08 | DRG: 177 | Disposition: A | Discharge status: Home Health | Payer: Medicare HMO | Source: Other Acute Inpatient Hospital | Attending: Internal Medicine | Admitting: Internal Medicine

## 2019-03-04 ENCOUNTER — Encounter (HOSPITAL_COMMUNITY): Payer: Self-pay | Admitting: Internal Medicine

## 2019-03-04 ENCOUNTER — Other Ambulatory Visit: Payer: Self-pay | Admitting: Internal Medicine

## 2019-03-04 DIAGNOSIS — G8929 Other chronic pain: Secondary | ICD-10-CM | POA: Diagnosis present

## 2019-03-04 DIAGNOSIS — R0600 Dyspnea, unspecified: Secondary | ICD-10-CM

## 2019-03-04 DIAGNOSIS — I10 Essential (primary) hypertension: Secondary | ICD-10-CM | POA: Diagnosis present

## 2019-03-04 DIAGNOSIS — R0902 Hypoxemia: Secondary | ICD-10-CM | POA: Diagnosis not present

## 2019-03-04 DIAGNOSIS — J9601 Acute respiratory failure with hypoxia: Secondary | ICD-10-CM | POA: Diagnosis not present

## 2019-03-04 DIAGNOSIS — Z87891 Personal history of nicotine dependence: Secondary | ICD-10-CM | POA: Diagnosis not present

## 2019-03-04 DIAGNOSIS — J45909 Unspecified asthma, uncomplicated: Secondary | ICD-10-CM | POA: Diagnosis not present

## 2019-03-04 DIAGNOSIS — E039 Hypothyroidism, unspecified: Secondary | ICD-10-CM | POA: Diagnosis present

## 2019-03-04 DIAGNOSIS — Z743 Need for continuous supervision: Secondary | ICD-10-CM | POA: Diagnosis not present

## 2019-03-04 DIAGNOSIS — J1282 Pneumonia due to coronavirus disease 2019: Secondary | ICD-10-CM | POA: Diagnosis not present

## 2019-03-04 DIAGNOSIS — R531 Weakness: Secondary | ICD-10-CM | POA: Diagnosis not present

## 2019-03-04 DIAGNOSIS — M549 Dorsalgia, unspecified: Secondary | ICD-10-CM | POA: Diagnosis not present

## 2019-03-04 DIAGNOSIS — R918 Other nonspecific abnormal finding of lung field: Secondary | ICD-10-CM | POA: Diagnosis not present

## 2019-03-04 DIAGNOSIS — R279 Unspecified lack of coordination: Secondary | ICD-10-CM | POA: Diagnosis not present

## 2019-03-04 DIAGNOSIS — U071 COVID-19: Principal | ICD-10-CM | POA: Diagnosis present

## 2019-03-04 DIAGNOSIS — R0602 Shortness of breath: Secondary | ICD-10-CM | POA: Diagnosis not present

## 2019-03-04 DIAGNOSIS — Z7401 Bed confinement status: Secondary | ICD-10-CM | POA: Diagnosis not present

## 2019-03-04 DIAGNOSIS — M545 Low back pain: Secondary | ICD-10-CM | POA: Diagnosis not present

## 2019-03-04 DIAGNOSIS — F0390 Unspecified dementia without behavioral disturbance: Secondary | ICD-10-CM | POA: Diagnosis present

## 2019-03-04 DIAGNOSIS — K219 Gastro-esophageal reflux disease without esophagitis: Secondary | ICD-10-CM | POA: Diagnosis present

## 2019-03-04 DIAGNOSIS — M2550 Pain in unspecified joint: Secondary | ICD-10-CM | POA: Diagnosis not present

## 2019-03-04 HISTORY — DX: Unspecified dementia, unspecified severity, without behavioral disturbance, psychotic disturbance, mood disturbance, and anxiety: F03.90

## 2019-03-04 HISTORY — DX: Acute respiratory failure with hypoxia: J96.01

## 2019-03-04 HISTORY — DX: COVID-19: U07.1

## 2019-03-04 HISTORY — DX: Essential (primary) hypertension: I10

## 2019-03-04 HISTORY — DX: Gastro-esophageal reflux disease without esophagitis: K21.9

## 2019-03-04 LAB — COMPREHENSIVE METABOLIC PANEL
ALT: 22 U/L (ref 0–44)
AST: 43 U/L — ABNORMAL HIGH (ref 15–41)
Albumin: 3 g/dL — ABNORMAL LOW (ref 3.5–5.0)
Alkaline Phosphatase: 50 U/L (ref 38–126)
Anion gap: 9 (ref 5–15)
BUN: 9 mg/dL (ref 8–23)
CO2: 23 mmol/L (ref 22–32)
Calcium: 8.2 mg/dL — ABNORMAL LOW (ref 8.9–10.3)
Chloride: 104 mmol/L (ref 98–111)
Creatinine, Ser: 0.7 mg/dL (ref 0.61–1.24)
GFR calc Af Amer: >60 mL/min (ref >60)
GFR calc non Af Amer: >60 mL/min (ref >60)
Glucose, Bld: 154 mg/dL — ABNORMAL HIGH (ref 70–99)
Potassium: 3.9 mmol/L (ref 3.5–5.1)
Sodium: 136 mmol/L (ref 135–145)
Total Bilirubin: 0.6 mg/dL (ref 0.3–1.2)
Total Protein: 6.3 g/dL — ABNORMAL LOW (ref 6.5–8.1)

## 2019-03-04 LAB — CBC WITH DIFFERENTIAL/PLATELET
Abs Immature Granulocytes: 0.2 10*3/uL — ABNORMAL HIGH (ref 0.00–0.07)
Basophils Absolute: 0 10*3/uL (ref 0.0–0.1)
Basophils Relative: 0 %
Eosinophils Absolute: 0 10*3/uL (ref 0.0–0.5)
Eosinophils Relative: 0 %
HCT: 46.4 % (ref 39.0–52.0)
Hemoglobin: 15.9 g/dL (ref 13.0–17.0)
Immature Granulocytes: 1 %
Lymphocytes Relative: 8 %
Lymphs Abs: 1.4 10*3/uL (ref 0.7–4.0)
MCH: 31.9 pg (ref 26.0–34.0)
MCHC: 34.3 g/dL (ref 30.0–36.0)
MCV: 93.2 fL (ref 80.0–100.0)
Monocytes Absolute: 0.3 10*3/uL (ref 0.1–1.0)
Monocytes Relative: 2 %
Neutro Abs: 15.9 10*3/uL — ABNORMAL HIGH (ref 1.7–7.7)
Neutrophils Relative %: 89 %
Platelets: 153 10*3/uL (ref 150–400)
RBC: 4.98 MIL/uL (ref 4.22–5.81)
RDW: 15.3 % (ref 11.5–15.5)
WBC: 17.8 10*3/uL — ABNORMAL HIGH (ref 4.0–10.5)
nRBC: 0 % (ref 0.0–0.2)

## 2019-03-04 LAB — FERRITIN: Ferritin: 375 ng/mL — ABNORMAL HIGH (ref 24–336)

## 2019-03-04 LAB — FIBRINOGEN: Fibrinogen: 621 mg/dL — ABNORMAL HIGH (ref 210–475)

## 2019-03-04 LAB — BRAIN NATRIURETIC PEPTIDE: B Natriuretic Peptide: 267.1 pg/mL — ABNORMAL HIGH (ref 0.0–100.0)

## 2019-03-04 LAB — D-DIMER, QUANTITATIVE: D-Dimer, Quant: 1.25 ug/mL-FEU — ABNORMAL HIGH (ref 0.00–0.50)

## 2019-03-04 LAB — LACTATE DEHYDROGENASE: LDH: 288 U/L — ABNORMAL HIGH (ref 98–192)

## 2019-03-04 LAB — PROCALCITONIN: Procalcitonin: 5.66 ng/mL

## 2019-03-04 LAB — C-REACTIVE PROTEIN: CRP: 9.5 mg/dL — ABNORMAL HIGH (ref <1.0)

## 2019-03-04 MED ORDER — ENOXAPARIN SODIUM 40 MG/0.4ML ~~LOC~~ SOLN
40.0000 mg | SUBCUTANEOUS | Status: DC
  Administered 2019-03-04 – 2019-03-07 (×4): 40 mg via SUBCUTANEOUS
  Filled 2019-03-04 (×4): qty 0.4

## 2019-03-04 MED ORDER — PANTOPRAZOLE SODIUM 40 MG PO TBEC
40.0000 mg | DELAYED_RELEASE_TABLET | Freq: Every day | ORAL | Status: DC
  Administered 2019-03-04 – 2019-03-08 (×5): 40 mg via ORAL
  Filled 2019-03-04 (×5): qty 1

## 2019-03-04 MED ORDER — FLUTICASONE PROPIONATE 50 MCG/ACT NA SUSP
2.0000 | Freq: Every day | NASAL | Status: DC
  Administered 2019-03-04 – 2019-03-08 (×5): 2 via NASAL
  Filled 2019-03-04: qty 16

## 2019-03-04 MED ORDER — DONEPEZIL HCL 10 MG PO TABS
10.0000 mg | ORAL_TABLET | Freq: Every day | ORAL | Status: DC
  Administered 2019-03-04 – 2019-03-07 (×4): 10 mg via ORAL
  Filled 2019-03-04 (×4): qty 1

## 2019-03-04 MED ORDER — AZITHROMYCIN 500 MG PO TABS
500.0000 mg | ORAL_TABLET | Freq: Every day | ORAL | Status: DC
  Administered 2019-03-04 – 2019-03-07 (×4): 500 mg via ORAL
  Filled 2019-03-04 (×3): qty 2
  Filled 2019-03-04: qty 1

## 2019-03-04 MED ORDER — LEVOTHYROXINE SODIUM 75 MCG PO TABS
125.0000 ug | ORAL_TABLET | Freq: Every day | ORAL | Status: DC
  Administered 2019-03-04 – 2019-03-08 (×5): 125 ug via ORAL
  Filled 2019-03-04 (×5): qty 1

## 2019-03-04 MED ORDER — ASCORBIC ACID 500 MG PO TABS
500.0000 mg | ORAL_TABLET | Freq: Every day | ORAL | Status: DC
  Administered 2019-03-04 – 2019-03-08 (×5): 500 mg via ORAL
  Filled 2019-03-04 (×5): qty 1

## 2019-03-04 MED ORDER — ACETAMINOPHEN 325 MG PO TABS
650.0000 mg | ORAL_TABLET | Freq: Four times a day (QID) | ORAL | Status: DC | PRN
  Administered 2019-03-07 (×2): 650 mg via ORAL
  Filled 2019-03-04 (×2): qty 2

## 2019-03-04 MED ORDER — SODIUM CHLORIDE 0.9 % IV SOLN
100.0000 mg | Freq: Every day | INTRAVENOUS | Status: AC
  Administered 2019-03-05 – 2019-03-08 (×4): 100 mg via INTRAVENOUS
  Filled 2019-03-04 (×4): qty 20

## 2019-03-04 MED ORDER — SODIUM CHLORIDE 0.9 % IV SOLN
1.0000 g | INTRAVENOUS | Status: DC
  Administered 2019-03-04 – 2019-03-07 (×4): 1 g via INTRAVENOUS
  Filled 2019-03-04 (×4): qty 10

## 2019-03-04 MED ORDER — MOMETASONE FURO-FORMOTEROL FUM 200-5 MCG/ACT IN AERO
2.0000 | INHALATION_SPRAY | Freq: Two times a day (BID) | RESPIRATORY_TRACT | Status: DC
  Administered 2019-03-04 – 2019-03-08 (×9): 2 via RESPIRATORY_TRACT
  Filled 2019-03-04: qty 8.8

## 2019-03-04 MED ORDER — ZINC SULFATE 220 (50 ZN) MG PO CAPS
220.0000 mg | ORAL_CAPSULE | Freq: Every day | ORAL | Status: DC
  Administered 2019-03-04 – 2019-03-08 (×5): 220 mg via ORAL
  Filled 2019-03-04 (×5): qty 1

## 2019-03-04 MED ORDER — MEMANTINE HCL 10 MG PO TABS
10.0000 mg | ORAL_TABLET | Freq: Every day | ORAL | Status: DC
  Administered 2019-03-04 – 2019-03-08 (×5): 10 mg via ORAL
  Filled 2019-03-04 (×5): qty 1

## 2019-03-04 MED ORDER — HYDROCOD POLST-CPM POLST ER 10-8 MG/5ML PO SUER: 5.0000 mL | Freq: Two times a day (BID) | ORAL | Status: DC | PRN

## 2019-03-04 MED ORDER — ONDANSETRON HCL 4 MG PO TABS
4.0000 mg | ORAL_TABLET | Freq: Four times a day (QID) | ORAL | Status: DC | PRN
  Administered 2019-03-04 – 2019-03-05 (×3): 4 mg via ORAL
  Filled 2019-03-04 (×3): qty 1

## 2019-03-04 MED ORDER — POLYETHYLENE GLYCOL 3350 17 G PO PACK
17.0000 g | PACK | Freq: Every day | ORAL | Status: DC
  Administered 2019-03-06 – 2019-03-08 (×3): 17 g via ORAL
  Filled 2019-03-04 (×3): qty 1

## 2019-03-04 MED ORDER — DOCUSATE SODIUM 100 MG PO CAPS
100.0000 mg | ORAL_CAPSULE | Freq: Every day | ORAL | Status: DC
  Administered 2019-03-04 – 2019-03-06 (×3): 100 mg via ORAL
  Filled 2019-03-04 (×3): qty 1

## 2019-03-04 MED ORDER — IPRATROPIUM-ALBUTEROL 20-100 MCG/ACT IN AERS
1.0000 | INHALATION_SPRAY | Freq: Four times a day (QID) | RESPIRATORY_TRACT | Status: DC
  Administered 2019-03-04 – 2019-03-08 (×16): 1 via RESPIRATORY_TRACT
  Filled 2019-03-04: qty 4

## 2019-03-04 MED ORDER — ONDANSETRON HCL 4 MG/2ML IJ SOLN: 4.0000 mg | Freq: Four times a day (QID) | INTRAMUSCULAR | Status: DC | PRN

## 2019-03-04 MED ORDER — GUAIFENESIN-DM 100-10 MG/5ML PO SYRP: 10.0000 mL | ORAL_SOLUTION | ORAL | Status: DC | PRN

## 2019-03-04 MED ORDER — DEXAMETHASONE 6 MG PO TABS
6.0000 mg | ORAL_TABLET | ORAL | Status: DC
  Administered 2019-03-04 – 2019-03-07 (×4): 6 mg via ORAL
  Filled 2019-03-04 (×4): qty 1

## 2019-03-04 NOTE — Progress Notes (Signed)
Spoke with patient's wife, updated her on patient's condition and answered any questions she had.

## 2019-03-04 NOTE — H&P (Signed)
TRH H&P   Patient Demographics:    Johnny Compton, is a 79 y.o. male  MRN: [id number]   DOB - 1940-04-24  Admit Date - 03/04/2019  Outpatient Primary MD for the patient is [name], MD  Patient coming from: [facility] health  No chief complaint on file.     HPI:    Johnny Compton  is a 79 y.o. male, dementia, asthma, HTN who presents as a transfer from [place] with acute respiratory failure with hypoxia secondary to SARS-CoV-2.  He has been having URI symptoms for nearly a week.  He was in the emergency room 1/22,  tested positive for SARS-CoV-2, he was not hypoxic, CTA was negative for PE and he was discharged home.  Returned 1/23 with worsening shortness of breath, sats in the 80s on room air, oxygen was started and titrated up to 5 L to maintain sats in the 90s.  Patient was also febrile with T-max of 102F.    His cough is intermittently productive of yellow sputum.  Denies any hemoptysis.  Reports bilateral lower chest pain, pleuritic, exacerbated by deep breathing or coughing.  He denies any orthopnea, PND.  No lower extremity edema.  He is a former smoker, smoked about half a pack per day 10 to 15 years quit more than 30 years ago.  He denies any pre-existing lung disease.     Review of systems:  Review of Systems:  Constitutional: negative for anorexia, chills, fatigue or fevers HEENT: negative for earaches, epistaxis, or sore throat Respiratory: See HPI Cardiovascular: negative for chest pain, palpitations, or syncope GU: negative for dysuria, urinary frequency, urinary urgency, hematuria Gastrointestinal: negative for abdominal pain, constipation, diarrhea, nausea or vomiting Musculoskeletal: negative  for arthralgias, back pain or myalgias Neurological: negative for dizziness, headaches or weakness Behavioral/Psych: negative for suicidal or homicidal ideation Skin:negative for rash Heme: negative for bruises Endo: negative for hair loss, weight gain/loss  With Past History of the following :   Past Medical History:  Diagnosis Date  . Dementia ([place])   . Essential hypertension   . GERD (gastroesophageal reflux disease)      History reviewed. No pertinent surgical history.   Social History:   Social History   Tobacco Use  . Smoking status: Not on file  Substance Use Topics  . Alcohol use: Not on  file     Family History :    History reviewed. No pertinent family history.   Home Medications:   Prior to Admission medications   Not on File     Allergies:    No Known Allergies   Physical Exam:   Vitals  Blood pressure 111/78, pulse 100, temperature 98.1 F (36.7 C), temperature source Oral, resp. rate (!) 22, height 6\' 2"  (1.88 m), SpO2 92 %.  Physical Exam   Constitutional - resting comfortably, no acute distress Eyes - pupils equal round and reactive to light and accomodation, extra ocular movements intact Nose - no gross deformity or drainage Mouth - no oral lesions noted Throat - no swelling or erythema Neck - supple, no JVD   CV - (+)S1S2, no murmurs  Resp -bilateral lower lung field crackles,  GI - (+)BS, soft, non-tender, non-distended Extrem - no clubbing, cyanosis, or peripheral edema  Skin - no rashes or wounds Neuro - alert, aware, oriented to person/place/time  Psych - normal affect, no anxiety   Patient has Pressure Ulcer on Admission?: no   Data Review:    CBC No results for input(s): WBC, HGB, HCT, PLT, MCV, MCH, MCHC, RDW, LYMPHSABS, MONOABS, EOSABS, BASOSABS, BANDABS in the last 168 hours.  Invalid input(s): NEUTRABS,  BANDSABD ------------------------------------------------------------------------------------------------------------------  Chemistries  No results for input(s): NA, K, CL, CO2, GLUCOSE, BUN, CREATININE, CALCIUM, MG, AST, ALT, ALKPHOS, BILITOT in the last 168 hours.  Invalid input(s): GFRCGP ------------------------------------------------------------------------------------------------------------------ CrCl cannot be calculated (No successful lab value found.). ------------------------------------------------------------------------------------------------------------------ No results for input(s): TSH, T4TOTAL, T3FREE, THYROIDAB in the last 72 hours.  Invalid input(s): FREET3  Coagulation profile No results for input(s): INR, PROTIME in the last 168 hours. ------------------------------------------------------------------------------------------------------------------- No results for input(s): DDIMER in the last 72 hours. -------------------------------------------------------------------------------------------------------------------  Cardiac Enzymes No results for input(s): CKMB, TROPONINI, MYOGLOBIN in the last 168 hours.  Invalid input(s): CK ------------------------------------------------------------------------------------------------------------------ No results found for: BNP   ---------------------------------------------------------------------------------------------------------------  Urinalysis No results found for: COLORURINE, APPEARANCEUR, LABSPEC, [place], GLUCOSEU, HGBUR, BILIRUBINUR, KETONESUR, PROTEINUR, UROBILINOGEN, NITRITE, LEUKOCYTESUR  ----------------------------------------------------------------------------------------------------------------   Imaging Results:    No results found.   Assessment & Plan:    Principal Problem:   Acute hypoxemic respiratory failure due to severe acute respiratory syndrome coronavirus 2 (SARS-CoV-2) disease  (HCC) Active Problems:   GERD (gastroesophageal reflux disease)   Essential hypertension   Dementia (HCC)     Acute hypoxemic respiratory failure due to SARS-CoV-2 disease: Patient presents from [facility] where he presented with worsening shortness of breath the day after testing positive for SARS-CoV-2, found to be in acute hypoxic respiratory failure.  CXR with bilateral infiltrates consistent with viral pneumonia.  Elevated inflammatory markers. Date of Dx: 1/22 Oxygen requirements: 5 LPM Antibiotics: Not indicated WBC and procalcitonin WNL Diuretics: Clinically euvolemic Vitamin C and Zinc: Per protocol Remdesivir: Started on 1/23 Steroids: Started on 1/23 Actemra: Not given yet Convalescent Plasma: Not given yet    GERD (gastroesophageal reflux disease): His symptoms are controlled on his home medications to be resumed once verified by pharmacy.    Essential hypertension: Blood pressure has been soft.  Holding antihypertensive medications.    Dementia [facility]): Patient has some memory loss.  Appears to be at his baseline.  Continue to monitor.  Avoid medications that may be associated with delirium   DVT Prophylaxis Lovenox  AM Labs Ordered, also please review Full Orders  Family Communication: Admission, patients condition and plan of care including tests being ordered have been discussed with  the patient who indicate understanding and agree with the plan and Code Status.  Code Status full code  Likely DC to home  Condition GUARDED    Consults called: None  Admission status: Admit to inpatient  Time spent in minutes : 56   Peyton Bottoms M.D on 03/04/2019 at 6:43 AM  To page go to [email or web address] - password [facility]

## 2019-03-04 NOTE — Progress Notes (Signed)
Patient arraived to the unit at 422am. Intermitten confusion noted. The patient was oriented to place, name, but not date and time. Answered questions appropriately. Denies Pain, Redness to buttocks noted. 02 via n/c at 5L. The patient was able to transfer from the bed to the [facility] with standby assistance. Dr. Vanita Ingles in to see paient and did assessment. The patient gait is unsteady.

## 2019-03-04 NOTE — Care Plan (Signed)
79 year old male with dementia, asthma, HTN awaiting transfer from [place] for mental respiratory failure with hypoxia secondary to SARS-CoV-2.  The patient and his wife were both SARS-CoV-2 positive, he was in the ED 1/22, he was not hypoxic, CTA was negative for PE and discharged home.  Returned 1/23 with worsening shortness of breath, sats in the 80s on room air, started on 5 L [place] O2.  Patient also febrile with T-max of 102F.  Soft blood pressure, elevated inflammatory markers.  WBC and procalcitonin WNL.  Started on Decadron and remdesivir.

## 2019-03-04 NOTE — Progress Notes (Signed)
Pt transferred from [facility] (spoke briefly with pharmacist)  Pt received: Remdesivir 200mg  IV 1/24 0115 Dexamethasone 6mg  IV 1/23 2247 Lovenox 60mg  SQ 1/24 0115  SCr 0.7, ALT wnl  Plan: Remdesivir 100mg  daily x 4 doses starting 1/25  Sherlon Handing, PharmD, BCPS Please see amion for complete clinical pharmacist phone list 03/04/2019 5:16 AM

## 2019-03-04 NOTE — Plan of Care (Signed)
Patient admitted

## 2019-03-04 NOTE — Progress Notes (Addendum)
PROGRESS NOTE  Johnny Compton [id number] DOB: [birth year] DOA: 03/04/2019  PCP: [name], MD  Brief History/Interval Summary: 79 y.o. male, dementia, asthma, HTN who presented as a transfer from [place] with acute respiratory failure with hypoxia secondary to SARS-CoV-2.  He has been having URI symptoms for nearly a week.  He was in the emergency room 1/22, tested positive for SARS-CoV-2, he was not hypoxic, CTA was negative for PE and he was discharged home. Returned 1/23with worsening shortness of breath, sats in the 80s on room air, oxygen was started and titrated up to 5 L to maintain sats in the 90s.  Patient was subsequently transferred here for further management.  Reason for Visit: Pneumonia due to COVID-19.  Acute respiratory failure with hypoxia.  Consultants: None  Procedures: None  Antibiotics: Anti-infectives (From admission, onward)   Start     Dose/Rate Route Frequency Ordered Stop   03/05/19 1000  remdesivir 100 mg in sodium chloride 0.9 % 100 mL IVPB     100 mg 200 mL/hr over 30 Minutes Intravenous Daily 03/04/19 0522 03/09/19 0959   03/04/19 1400  cefTRIAXone (ROCEPHIN) 1 g in sodium chloride 0.9 % 100 mL IVPB     1 g 200 mL/hr over 30 Minutes Intravenous Every 24 hours 03/04/19 1352 03/09/19 1359   03/04/19 1400  azithromycin (ZITHROMAX) tablet 500 mg     500 mg Oral Daily 03/04/19 1352 03/09/19 0959      Subjective/Interval History: Patient states that he is still very short of breath but feels slightly better compared to yesterday.  Denies any dizziness or lightheadedness.  No chest pain.  Dry cough.    Assessment/Plan:  Acute Hypoxic Resp. Failure/Pneumonia due to COVID-19   Recent Labs  Lab 03/04/19 1030  DDIMER 1.25*  FERRITIN 375*  CRP 9.5*  ALT 22  PROCALCITON 5.66    Objective findings: Fever: Noted to be afebrile Oxygen requirements: On nasal cannula 5 L/min.  Saturating in the early 90s  COVID 19  Therapeutics: Antibacterials: None Remdesivir: Day 2 Steroids: Dexamethasone 6 mg daily Diuretics: None Actemra: Not given yet Convalescent Plasma: Not given yet Vitamin C and Zinc: Continue PUD Prophylaxis: Initiate Protonix DVT Prophylaxis:  Lovenox 40 mg daily  From a respiratory standpoint patient is stable.  He is requiring 5 L of oxygen.  He still feels short of breath.  He has been started on remdesivir and steroids.  Continue with incentive spirometry mobilization.  Prone positioning as tolerated.  Out of bed to chair.  Check inflammatory markers.  May need to offer convalescent plasma and Actemra depending on his inflammatory markers.  ADDENDUM Procalcitonin noted to be elevated.  WBC also elevated which could be due to steroids.  We will add antibacterials.  Hypothyroidism Continue levothyroxine.  History of dementia Seems to be mild.  Patient was able to answer all my questions this morning.  He is noted to be on donepezil as well as memantine at home which will be continued.  Essential hypertension Not noted to be on any antihypertensives at home.  Blood pressure was soft.  Continue to monitor for now.  Blood work pending from this morning.    DVT Prophylaxis: Lovenox Code Status: Full code Family Communication: We will attempt to reach family later today Disposition Plan: He lives with his wife.  Mobilize him.  Hopefully he will be able to return home when improved.   Medications:  Scheduled: . vitamin C  500 mg Oral Daily  .  azithromycin  500 mg Oral Daily  . dexamethasone  6 mg Oral Q24H  . docusate sodium  100 mg Oral Daily  . donepezil  10 mg Oral QHS  . enoxaparin (LOVENOX) injection  40 mg Subcutaneous Q24H  . fluticasone  2 spray Each Nare Daily  . Ipratropium-Albuterol  1 puff Inhalation Q6H  . levothyroxine  125 mcg Oral QAC breakfast  . memantine  10 mg Oral Daily  . mometasone-formoterol  2 puff Inhalation BID  . pantoprazole  40 mg Oral Daily  .  polyethylene glycol  17 g Oral Daily  . zinc sulfate  220 mg Oral Daily   Continuous: . cefTRIAXone (ROCEPHIN)  IV    . [START ON 03/05/2019] remdesivir 100 mg in NS 100 mL     [id number], chlorpheniramine-HYDROcodone, guaiFENesin-dextromethorphan, ondansetron **OR** ondansetron (ZOFRAN) IV   Objective:  Vital Signs  Vitals:   03/04/19 0536 03/04/19 0625 03/04/19 0800  BP: 111/78  118/75  Pulse:  100 87  Resp:  (!) 22 18  Temp: 98.1 F (36.7 C)  98 F (36.7 C)  TempSrc: Oral Oral Oral  SpO2: 92%  90%  Height:  6\' 2"  (1.88 m)    No intake or output data in the 24 hours ending 03/04/19 1353 There were no vitals filed for this visit.  General appearance: Awake alert.  In no distress.  Mildly distracted Resp: Mildly tachypneic at rest.  No use of accessory muscles.  Crackles bilateral bases.  No wheezing or rhonchi.   Cardio: S1-S2 is normal regular.  No S3-S4.  No rubs murmurs or bruit GI: Abdomen is soft.  Nontender nondistended.  Bowel sounds are present normal.  No masses organomegaly Extremities: No edema.  Moving all his extremities. Neurologic: No focal neurological deficits.    Lab Results:  Data Reviewed: I have personally reviewed following labs and imaging studies  CBC: Recent Labs  Lab 03/04/19 1030  WBC 17.8*  NEUTROABS 15.9*  HGB 15.9  HCT 46.4  MCV 93.2  PLT 0000000    Basic Metabolic Panel: Recent Labs  Lab 03/04/19 1030  NA 136  K 3.9  CL 104  CO2 23  GLUCOSE 154*  BUN 9  CREATININE 0.70  CALCIUM 8.2*    GFR: CrCl cannot be calculated (Unknown ideal weight.).  Liver Function Tests: Recent Labs  Lab 03/04/19 1030  AST 43*  ALT 22  ALKPHOS 50  BILITOT 0.6  PROT 6.3*  ALBUMIN 3.0*    No results for input(s): LIPASE, AMYLASE in the last 168 hours. No results for input(s): AMMONIA in the last 168 hours.  Coagulation Profile: No results for input(s): INR, PROTIME in the last 168 hours.  Cardiac Enzymes: No results for  input(s): CKTOTAL, CKMB, CKMBINDEX, TROPONINI in the last 168 hours.  BNP (last 3 results) No results for input(s): PROBNP in the last 8760 hours.  HbA1C: No results for input(s): HGBA1C in the last 72 hours.  CBG: No results for input(s): GLUCAP in the last 168 hours.  Lipid Profile: No results for input(s): CHOL, HDL, LDLCALC, TRIG, CHOLHDL, LDLDIRECT in the last 72 hours.  Thyroid Function Tests: No results for input(s): TSH, T4TOTAL, FREET4, T3FREE, THYROIDAB in the last 72 hours.  Anemia Panel: Recent Labs    03/04/19 1030  FERRITIN 375*    No results found for this or any previous visit (from the past 240 hour(s)).    Radiology Studies: No results found.     LOS: 0 days  Kashmir Lysaght [employer]  [employer] operational officer on [email or web address]  03/04/2019, 1:53 PM

## 2019-03-05 ENCOUNTER — Other Ambulatory Visit: Payer: Self-pay

## 2019-03-05 ENCOUNTER — Encounter (HOSPITAL_COMMUNITY): Payer: Self-pay | Admitting: Internal Medicine

## 2019-03-05 LAB — COMPREHENSIVE METABOLIC PANEL
ALT: 21 U/L (ref 0–44)
AST: 39 U/L (ref 15–41)
Albumin: 3 g/dL — ABNORMAL LOW (ref 3.5–5.0)
Alkaline Phosphatase: 53 U/L (ref 38–126)
Anion gap: 9 (ref 5–15)
BUN: 12 mg/dL (ref 8–23)
CO2: 23 mmol/L (ref 22–32)
Calcium: 8.7 mg/dL — ABNORMAL LOW (ref 8.9–10.3)
Chloride: 105 mmol/L (ref 98–111)
Creatinine, Ser: 0.57 mg/dL — ABNORMAL LOW (ref 0.61–1.24)
GFR calc Af Amer: >60 mL/min (ref >60)
GFR calc non Af Amer: >60 mL/min (ref >60)
Glucose, Bld: 126 mg/dL — ABNORMAL HIGH (ref 70–99)
Potassium: 4.5 mmol/L (ref 3.5–5.1)
Sodium: 137 mmol/L (ref 135–145)
Total Bilirubin: 0.5 mg/dL (ref 0.3–1.2)
Total Protein: 6.4 g/dL — ABNORMAL LOW (ref 6.5–8.1)

## 2019-03-05 LAB — CBC WITH DIFFERENTIAL/PLATELET
Abs Immature Granulocytes: 0.11 10*3/uL — ABNORMAL HIGH (ref 0.00–0.07)
Basophils Absolute: 0 10*3/uL (ref 0.0–0.1)
Basophils Relative: 0 %
Eosinophils Absolute: 0 10*3/uL (ref 0.0–0.5)
Eosinophils Relative: 0 %
HCT: 46.8 % (ref 39.0–52.0)
Hemoglobin: 16.1 g/dL (ref 13.0–17.0)
Immature Granulocytes: 1 %
Lymphocytes Relative: 9 %
Lymphs Abs: 1.3 10*3/uL (ref 0.7–4.0)
MCH: 32 pg (ref 26.0–34.0)
MCHC: 34.4 g/dL (ref 30.0–36.0)
MCV: 93 fL (ref 80.0–100.0)
Monocytes Absolute: 0.5 10*3/uL (ref 0.1–1.0)
Monocytes Relative: 3 %
Neutro Abs: 13 10*3/uL — ABNORMAL HIGH (ref 1.7–7.7)
Neutrophils Relative %: 87 %
Platelets: 160 10*3/uL (ref 150–400)
RBC: 5.03 MIL/uL (ref 4.22–5.81)
RDW: 15.3 % (ref 11.5–15.5)
WBC: 15 10*3/uL — ABNORMAL HIGH (ref 4.0–10.5)
nRBC: 0 % (ref 0.0–0.2)

## 2019-03-05 LAB — FERRITIN: Ferritin: 370 ng/mL — ABNORMAL HIGH (ref 24–336)

## 2019-03-05 LAB — PROCALCITONIN: Procalcitonin: 4.95 ng/mL

## 2019-03-05 LAB — C-REACTIVE PROTEIN: CRP: 11.4 mg/dL — ABNORMAL HIGH (ref <1.0)

## 2019-03-05 LAB — D-DIMER, QUANTITATIVE: D-Dimer, Quant: 1.08 ug/mL-FEU — ABNORMAL HIGH (ref 0.00–0.50)

## 2019-03-05 MED ORDER — AMITRIPTYLINE HCL 50 MG PO TABS
100.0000 mg | ORAL_TABLET | Freq: Every day | ORAL | Status: DC
  Administered 2019-03-06 – 2019-03-07 (×3): 100 mg via ORAL
  Filled 2019-03-05: qty 1
  Filled 2019-03-05: qty 2
  Filled 2019-03-05 (×2): qty 1
  Filled 2019-03-05: qty 2

## 2019-03-05 NOTE — Progress Notes (Signed)
Patient is alert and verbal. Confusion noted. No complaints of painor discomfort. Call light within reach.

## 2019-03-05 NOTE — Progress Notes (Signed)
PROGRESS NOTE  Johnny Compton [id number] DOB: [birth year] DOA: 03/04/2019  PCP: [name], MD  Brief History/Interval Summary: 79 y.o. male, dementia, asthma, HTN who presented as a transfer from [place] with acute respiratory failure with hypoxia secondary to SARS-CoV-2.  He has been having URI symptoms for nearly a week.  He was in the emergency room 1/22, tested positive for SARS-CoV-2, he was not hypoxic, CTA was negative for PE and he was discharged home. Returned 1/23with worsening shortness of breath, sats in the 80s on room air, oxygen was started and titrated up to 5 L to maintain sats in the 90s.  Patient was subsequently transferred here for further management.  Reason for Visit: Pneumonia due to COVID-19.  Acute respiratory failure with hypoxia.  Consultants: None  Procedures: None  Antibiotics: Anti-infectives (From admission, onward)   Start     Dose/Rate Route Frequency Ordered Stop   03/05/19 1000  remdesivir 100 mg in sodium chloride 0.9 % 100 mL IVPB     100 mg 200 mL/hr over 30 Minutes Intravenous Daily 03/04/19 0522 03/09/19 0959   03/04/19 1500  cefTRIAXone (ROCEPHIN) 1 g in sodium chloride 0.9 % 100 mL IVPB     1 g 200 mL/hr over 30 Minutes Intravenous Every 24 hours 03/04/19 1352 03/09/19 1459   03/04/19 1500  azithromycin (ZITHROMAX) tablet 500 mg     500 mg Oral Daily 03/04/19 1352 03/09/19 0959      Subjective/Interval History: Patient continues to have some difficulty breathing.  He is coughing up a little bit of blood in his sputum.  Denies any chest pain.  No dizziness or lightheadedness.      Assessment/Plan:  Acute Hypoxic Resp. Failure/Pneumonia due to COVID-19   Recent Labs  Lab 03/04/19 1030 03/05/19 0353  DDIMER 1.25* 1.08*  FERRITIN 375* 370*  CRP 9.5* 11.4*  ALT 22 21  PROCALCITON 5.66 4.95    Objective findings: Fever: Remains afebrile Oxygen requirements: Nasal cannula.  3 L.  Saturating in the early 90s.     COVID 19 Therapeutics: Antibacterials: Ceftriaxone and azithromycin, started on 1/24 for a 5-day course Remdesivir: Day 3 Steroids: Dexamethasone 6 mg daily Diuretics: None Actemra: Not given yet Convalescent Plasma: Not given yet Vitamin C and Zinc: Continue PUD Prophylaxis: Initiate Protonix DVT Prophylaxis:  Lovenox 40 mg daily  From a respiratory standpoint patient is stable.  He is experiencing mild hemoptysis.  His D-dimer is 1.08 and has been improving.  Continue just prophylactic doses of Lovenox for now.  Hemoptysis is likely due to pneumonia.  Continue with remdesivir and steroids.  CRP noted to be slightly higher today compared to yesterday.  Continue current dose of steroids.  Hold off on convalescent plasma and Actemra due to clinical improvement.  Out of bed to chair.  His procalcitonin was noted to be elevated and the patient was started on ceftriaxone and azithromycin for a 5-day course.  WBC slightly better today.  Procalcitonin is also improved.  Hypothyroidism Continue levothyroxine.  History of dementia Seems to be mild.  Continue to monitor.  Continue donepezil and memantine.   Essential hypertension Not noted to be on any antihypertensives at home.  Blood pressure stable.  Continue to monitor.     DVT Prophylaxis: Lovenox Code Status: Full code Family Communication: Wife was updated yesterday.  Will do so again today. Disposition Plan: He lives with his wife.  Mobilize him.  PT and OT evaluation.  Hopefully he will be able to  return home when improved.   Medications:  Scheduled: . vitamin C  500 mg Oral Daily  . azithromycin  500 mg Oral Daily  . dexamethasone  6 mg Oral Q24H  . docusate sodium  100 mg Oral Daily  . donepezil  10 mg Oral QHS  . enoxaparin (LOVENOX) injection  40 mg Subcutaneous Q24H  . fluticasone  2 spray Each Nare Daily  . Ipratropium-Albuterol  1 puff Inhalation Q6H  . levothyroxine  125 mcg Oral QAC breakfast  . memantine  10 mg  Oral Daily  . mometasone-formoterol  2 puff Inhalation BID  . pantoprazole  40 mg Oral Daily  . polyethylene glycol  17 g Oral Daily  . zinc sulfate  220 mg Oral Daily   Continuous: . cefTRIAXone (ROCEPHIN)  IV 1 g (03/04/19 1532)  . remdesivir 100 mg in NS 100 mL 100 mg (03/05/19 0924)   [id number], chlorpheniramine-HYDROcodone, guaiFENesin-dextromethorphan, ondansetron **OR** ondansetron (ZOFRAN) IV   Objective:  Vital Signs  Vitals:   03/04/19 1900 03/05/19 0000 03/05/19 0400 03/05/19 0747  BP: 113/70   110/62  Pulse:    95  Resp:    (!) 22  Temp: 98.9 F (37.2 C) 98.7 F (37.1 C) 98.5 F (36.9 C) 97.7 F (36.5 C)  TempSrc: Oral Oral Oral Oral  SpO2:    93%  Weight:      Height:        Intake/Output Summary (Last 24 hours) at 03/05/2019 1108 Last data filed at 03/05/2019 1000 Gross per 24 hour  Intake 100 ml  Output 1700 ml  Net -1600 ml   Filed Weights   03/04/19 1630  Weight: 119 kg    General appearance: Awake alert.  In no distress.  Mildly distracted Resp: Not as tachypneic as yesterday.  Few crackles bilateral bases.  No wheezing or rhonchi.   Cardio: S1-S2 is normal regular.  No S3-S4.  No rubs murmurs or bruit GI: Abdomen is soft.  Nontender nondistended.  Bowel sounds are present normal.  No masses organomegaly Extremities: No edema.  He is able to move all his extremities Neurologic: No focal neurological deficits.     Lab Results:  Data Reviewed: I have personally reviewed following labs and imaging studies  CBC: Recent Labs  Lab 03/04/19 1030 03/05/19 0353  WBC 17.8* 15.0*  NEUTROABS 15.9* 13.0*  HGB 15.9 16.1  HCT 46.4 46.8  MCV 93.2 93.0  PLT 153 0000000    Basic Metabolic Panel: Recent Labs  Lab 03/04/19 1030 03/05/19 0353  NA 136 137  K 3.9 4.5  CL 104 105  CO2 23 23  GLUCOSE 154* 126*  BUN 9 12  CREATININE 0.70 0.57*  CALCIUM 8.2* 8.7*    GFR: Estimated Creatinine Clearance: 102.6 mL/min (A) (by C-G formula  based on SCr of 0.57 mg/dL (L)).  Liver Function Tests: Recent Labs  Lab 03/04/19 1030 03/05/19 0353  AST 43* 39  ALT 22 21  ALKPHOS 50 53  BILITOT 0.6 0.5  PROT 6.3* 6.4*  ALBUMIN 3.0* 3.0*    Anemia Panel: Recent Labs    03/04/19 1030 03/05/19 0353  FERRITIN 375* 370*    No results found for this or any previous visit (from the past 240 hour(s)).    Radiology Studies: No results found.     LOS: 1 day   Burwell [place] [employer] on [email or web address]  03/05/2019, 11:08 AM

## 2019-03-06 LAB — COMPREHENSIVE METABOLIC PANEL
ALT: 19 U/L (ref 0–44)
AST: 30 U/L (ref 15–41)
Albumin: 2.9 g/dL — ABNORMAL LOW (ref 3.5–5.0)
Alkaline Phosphatase: 52 U/L (ref 38–126)
Anion gap: 9 (ref 5–15)
BUN: 15 mg/dL (ref 8–23)
CO2: 23 mmol/L (ref 22–32)
Calcium: 8.4 mg/dL — ABNORMAL LOW (ref 8.9–10.3)
Chloride: 100 mmol/L (ref 98–111)
Creatinine, Ser: 0.58 mg/dL — ABNORMAL LOW (ref 0.61–1.24)
GFR calc Af Amer: >60 mL/min (ref >60)
GFR calc non Af Amer: >60 mL/min (ref >60)
Glucose, Bld: 117 mg/dL — ABNORMAL HIGH (ref 70–99)
Potassium: 4.1 mmol/L (ref 3.5–5.1)
Sodium: 132 mmol/L — ABNORMAL LOW (ref 135–145)
Total Bilirubin: 0.7 mg/dL (ref 0.3–1.2)
Total Protein: 6.1 g/dL — ABNORMAL LOW (ref 6.5–8.1)

## 2019-03-06 LAB — FERRITIN: Ferritin: 412 ng/mL — ABNORMAL HIGH (ref 24–336)

## 2019-03-06 LAB — CBC WITH DIFFERENTIAL/PLATELET
Abs Immature Granulocytes: 0.11 10*3/uL — ABNORMAL HIGH (ref 0.00–0.07)
Basophils Absolute: 0 10*3/uL (ref 0.0–0.1)
Basophils Relative: 0 %
Eosinophils Absolute: 0 10*3/uL (ref 0.0–0.5)
Eosinophils Relative: 0 %
HCT: 45 % (ref 39.0–52.0)
Hemoglobin: 15.2 g/dL (ref 13.0–17.0)
Immature Granulocytes: 1 %
Lymphocytes Relative: 12 %
Lymphs Abs: 1.5 10*3/uL (ref 0.7–4.0)
MCH: 31.2 pg (ref 26.0–34.0)
MCHC: 33.8 g/dL (ref 30.0–36.0)
MCV: 92.4 fL (ref 80.0–100.0)
Monocytes Absolute: 0.5 10*3/uL (ref 0.1–1.0)
Monocytes Relative: 4 %
Neutro Abs: 10.4 10*3/uL — ABNORMAL HIGH (ref 1.7–7.7)
Neutrophils Relative %: 83 %
Platelets: 202 10*3/uL (ref 150–400)
RBC: 4.87 MIL/uL (ref 4.22–5.81)
RDW: 14.9 % (ref 11.5–15.5)
WBC: 12.5 10*3/uL — ABNORMAL HIGH (ref 4.0–10.5)
nRBC: 0 % (ref 0.0–0.2)

## 2019-03-06 LAB — D-DIMER, QUANTITATIVE: D-Dimer, Quant: 0.86 ug/mL-FEU — ABNORMAL HIGH (ref 0.00–0.50)

## 2019-03-06 LAB — PROCALCITONIN: Procalcitonin: 2.51 ng/mL

## 2019-03-06 LAB — C-REACTIVE PROTEIN: CRP: 4.4 mg/dL — ABNORMAL HIGH (ref <1.0)

## 2019-03-06 MED ORDER — SENNOSIDES-DOCUSATE SODIUM 8.6-50 MG PO TABS
1.0000 | ORAL_TABLET | Freq: Two times a day (BID) | ORAL | Status: DC
  Administered 2019-03-06 – 2019-03-08 (×4): 1 via ORAL
  Filled 2019-03-06 (×4): qty 1

## 2019-03-06 NOTE — Progress Notes (Signed)
PROGRESS NOTE  Johnny Compton [id number] DOB: Aug 25, 1940 DOA: 03/04/2019  PCP: [name], MD  Brief History/Interval Summary: 79 y.o. male, dementia, asthma, HTN who presented as a transfer from [place] with acute respiratory failure with hypoxia secondary to SARS-CoV-2.  He has been having URI symptoms for nearly a week.  He was in the emergency room 1/22, tested positive for SARS-CoV-2, he was not hypoxic, CTA was negative for PE and he was discharged home. Returned 1/23with worsening shortness of breath, sats in the 80s on room air, oxygen was started and titrated up to 5 L to maintain sats in the 90s.  Patient was subsequently transferred here for further management.  Reason for Visit: Pneumonia due to COVID-19.  Acute respiratory failure with hypoxia.  Consultants: None  Procedures: None  Antibiotics: Anti-infectives (From admission, onward)   Start     Dose/Rate Route Frequency Ordered Stop   03/05/19 1000  remdesivir 100 mg in sodium chloride 0.9 % 100 mL IVPB     100 mg 200 mL/hr over 30 Minutes Intravenous Daily 03/04/19 0522 03/09/19 0959   03/04/19 1500  cefTRIAXone (ROCEPHIN) 1 g in sodium chloride 0.9 % 100 mL IVPB     1 g 200 mL/hr over 30 Minutes Intravenous Every 24 hours 03/04/19 1352 03/09/19 1459   03/04/19 1500  azithromycin (ZITHROMAX) tablet 500 mg     500 mg Oral Daily 03/04/19 1352 03/09/19 0959      Subjective/Interval History: Patient states that he has not had any further blood in his sputum.  Overall he is feeling slightly better.  Denies any chest pain.  No dizziness or lightheadedness.     Assessment/Plan:  Acute Hypoxic Resp. Failure/Pneumonia due to COVID-19   Recent Labs  Lab 03/04/19 1030 03/05/19 0353 03/06/19 0400  DDIMER 1.25* 1.08* 0.86*  FERRITIN 375* 370* 412*  CRP 9.5* 11.4* 4.4*  ALT 22 21 19   PROCALCITON 5.66 4.95 2.51    Objective findings: Fever: Remains afebrile Oxygen requirements: Nasal cannula.  3  L/min.  Saturating in the early 90s.    COVID 19 Therapeutics: Antibacterials: Ceftriaxone and azithromycin, started on 1/24 for a 5-day course Remdesivir: Day 4 Steroids: Dexamethasone 6 mg daily Diuretics: None Actemra: Not given yet Convalescent Plasma: Not given yet Vitamin C and Zinc: Continue PUD Prophylaxis: Protonix DVT Prophylaxis:  Lovenox 40 mg daily  From a respiratory standpoint patient appears to be improving.  No further episodes of hemoptysis.  His oxygen requirements are coming down.  D-dimer has improved.  Procalcitonin is also improving.  CRP is also better.  Continue with remdesivir and steroids.  He is also on antibacterials for a 5-day course.  WBC is better today.  Mobilize.  Incentive spirometry.  Hypothyroidism Continue levothyroxine.  History of dementia Seems to be mild.  Continue to monitor.  Continue donepezil and memantine.   Essential hypertension Not noted to be on any antihypertensives at home.  Blood pressure remained stable.   DVT Prophylaxis: Lovenox Code Status: Full code Family Communication: Wife being updated daily. Disposition Plan: He lives with his wife.  Mobilize.  PT and OT evaluation.  Hopefully he will be able to return home when improved.   Medications:  Scheduled: . amitriptyline  100 mg Oral QHS  . vitamin C  500 mg Oral Daily  . azithromycin  500 mg Oral Daily  . dexamethasone  6 mg Oral Q24H  . docusate sodium  100 mg Oral Daily  . donepezil  10  mg Oral QHS  . enoxaparin (LOVENOX) injection  40 mg Subcutaneous Q24H  . fluticasone  2 spray Each Nare Daily  . Ipratropium-Albuterol  1 puff Inhalation Q6H  . levothyroxine  125 mcg Oral QAC breakfast  . memantine  10 mg Oral Daily  . mometasone-formoterol  2 puff Inhalation BID  . pantoprazole  40 mg Oral Daily  . polyethylene glycol  17 g Oral Daily  . zinc sulfate  220 mg Oral Daily   Continuous: . cefTRIAXone (ROCEPHIN)  IV 1 g (03/05/19 1407)  . remdesivir 100 mg in  NS 100 mL 100 mg (03/06/19 1022)   [id number], chlorpheniramine-HYDROcodone, guaiFENesin-dextromethorphan, ondansetron **OR** ondansetron (ZOFRAN) IV   Objective:  Vital Signs  Vitals:   03/05/19 2017 03/06/19 0010 03/06/19 0616 03/06/19 0813  BP: (!) 124/98 112/75 127/81 108/77  Pulse: 90 78 88 93  Resp: (!) 22 (!) 23 (!) 29 (!) 23  Temp: 98.2 F (36.8 C) 97.9 F (36.6 C) 97.9 F (36.6 C) 97.7 F (36.5 C)  TempSrc: Oral Oral Axillary Oral  SpO2: 94% 93% 91% 94%  Weight:      Height:       No intake or output data in the 24 hours ending 03/06/19 1103 Filed Weights   03/04/19 1630  Weight: 119 kg    General appearance: Awake alert.  In no distress Resp: Improved air entry.  Crackles bilateral bases.  No wheezing or rhonchi.   Cardio: S1-S2 is normal regular.  No S3-S4.  No rubs murmurs or bruit GI: Abdomen is soft.  Nontender nondistended.  Bowel sounds are present normal.  No masses organomegaly Extremities: No edema.  Full range of motion of lower extremities. Neurologic:   No focal neurological deficits.     Lab Results:  Data Reviewed: I have personally reviewed following labs and imaging studies  CBC: Recent Labs  Lab 03/04/19 1030 03/05/19 0353 03/06/19 0400  WBC 17.8* 15.0* 12.5*  NEUTROABS 15.9* 13.0* 10.4*  HGB 15.9 16.1 15.2  HCT 46.4 46.8 45.0  MCV 93.2 93.0 92.4  PLT 153 160 [id number]    Basic Metabolic Panel: Recent Labs  Lab 03/04/19 1030 03/05/19 0353 03/06/19 0400  NA 136 137 132*  K 3.9 4.5 4.1  CL 104 105 100  CO2 23 23 23   GLUCOSE 154* 126* 117*  BUN 9 12 15   CREATININE 0.70 0.57* 0.58*  CALCIUM 8.2* 8.7* 8.4*    GFR: Estimated Creatinine Clearance: 102.6 mL/min (A) (by C-G formula based on SCr of 0.58 mg/dL (L)).  Liver Function Tests: Recent Labs  Lab 03/04/19 1030 03/05/19 0353 03/06/19 0400  AST 43* 39 30  ALT 22 21 19   ALKPHOS 50 53 52  BILITOT 0.6 0.5 0.7  PROT 6.3* 6.4* 6.1*  ALBUMIN 3.0* 3.0* 2.9*     Anemia Panel: Recent Labs    03/05/19 0353 03/06/19 0400  FERRITIN 370* 412*    No results found for this or any previous visit (from the past 240 hour(s)).    Radiology Studies: No results found.     LOS: 2 days   [facility] Pager on [email or web address]  03/06/2019, 11:03 AM

## 2019-03-06 NOTE — Progress Notes (Signed)
Occupational Therapy Evaluation  Clinical Impression: PTA pt lived with his wife and children, independent in ADLs and mobility. Pt does not ambulate with an assistive device and reports 2 falls in the last 6 months. Pt does not use oxygen at home and is currently on 3L [place]. Pt currently independent to min assist for self-care and mobility tasks. Pt seated on bedside commode upon OT arrival, requiring min assist for task. Pt able to ambulate to/from bedroom sink with min HHA and stand ~13 min to complete grooming and sponge bathing task. SpO2 maintained in 90s on room air with pt reporting min shortness of breath throughout. RN updated. Pt demonstrates decreased ROM, strength, endurance, balance, standing tolerance, and activity tolerance impacting ability to complete self-care and functional transfer tasks. Recommend skilled OT services to address above deficits in order to promote function and prevent further decline. Recommend [place] OT for continued rehab following hospital discharge.     03/06/19 0853  OT Visit Information  Last OT Received On 03/06/19  Assistance Needed +1  PT/OT/SLP Co-Evaluation/Treatment Yes  Reason for Co-Treatment For patient/therapist safety;To address functional/ADL transfers  OT goals addressed during session ADL's and self-care  History of Present Illness Pt is a 79 y.o. male with recent COVID-19 dx (03/02/19) but not hypoxic and sent home, now admitted 03/04/19 with acute respiratory failure with hypoxia secondary to SARS-CoV-2. PMH includes dementia, asthma, HTN.  Precautions  Precautions Fall  Restrictions  Weight Bearing Restrictions No  Home Living  Family/patient expects to be discharged to: Private residence  Living Arrangements Spouse/significant other;Children  Available Help at Discharge Family (Wife, son, and daughter can assist)  Type of [place] Access Level entry  Home Layout One level  Bathroom Shower/Tub Walk-in shower  [place] bars - tub/shower;Walker - 4 wheels  Prior Function  Level of Independence Independent  Comments Pt independent in ADLs and mobility. Pt reports ambulating without an assistive device and has had 2 falls in the last 6 months. Wife completes IADLs. Pt still drives. Pt does not use oxygen at home. 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   Upper Extremity Assessment  Upper Extremity Assessment LUE deficits/detail;Generalized weakness (RUE ROM WFL.)  LUE Deficits / Details LUE shld flex ~45 degrees. Elbow, wrist, and digit ROM WFL.  Lower Extremity Assessment  Lower Extremity Assessment Defer to PT evaluation  Cervical / Trunk Assessment  Cervical / Trunk Assessment Kyphotic  ADL  Overall ADL's  Needs assistance/impaired  Eating/Feeding Sitting;Independent  Grooming Min guard;Standing  Upper Body Bathing Min guard;Standing  Lower Body Bathing Minimal assistance;Min guard;Sit to/from stand  Upper Body Dressing  Set up;Supervision/safety;Sitting  Lower Body Dressing Set up;Supervision/safety;Sit to/from stand;Sitting/lateral leans  Toilet Transfer Minimal assistance;BSC;Stand-pivot  Toileting- Clothing Manipulation and Hygiene Minimal assistance;Min guard;Sit to/from stand  Functional mobility during ADLs Minimal assistance (hand held assist)  General ADL Comments Pt able to ambulate to/from bedroom sink with min hand held assist. Pt tolerated standing ~13 min at the sink to complete grooming and sponge bathing task.    Vision- History  Baseline Vision/History Wears glasses  Wears Glasses Reading only  Bed Mobility  General bed mobility comments Pt seated on bedside commode upon OT arrival  Transfers  Overall transfer level Needs assistance  Equipment used 1 person hand held assist  Transfers Sit to/from Stand;Stand Pivot Transfers  Sit to [place] assist;Mod assist  Stand pivot transfers Min assist  General transfer comment To ensure balance and safety. Noted 0 instances of LOB, however pt unsteady on feet.  Balance  Overall balance assessment Needs assistance  Sitting-balance support No upper extremity supported;Feet supported  Sitting balance-Leahy Scale Fair  Standing balance support No upper extremity supported;During functional activity  Standing balance-Leahy Scale Fair  General Comments  General comments (skin integrity, edema, etc.) SpO2 100% on 3L O2 [place] at rest. SpO2 maintained in 90s throughout on room air.   OT - End of Session  Equipment Utilized During Treatment Oxygen  Activity Tolerance Patient tolerated treatment well  Patient left in chair;with call bell/phone within reach;with chair alarm set  Nurse Communication Mobility status  OT Assessment  OT Recommendation/Assessment Patient needs continued OT Services  OT Visit Diagnosis Unsteadiness on feet (R26.81);Muscle weakness (generalized) (M62.81)  OT Problem List Decreased strength;Decreased range of motion;Decreased activity tolerance;Impaired balance (sitting and/or standing);Decreased knowledge of use of DME or AE;Cardiopulmonary status limiting activity  OT Plan  OT Frequency (ACUTE ONLY) Min 3X/week  OT Treatment/Interventions (ACUTE ONLY) Self-care/ADL training;Therapeutic exercise;Neuromuscular education;Energy conservation;DME and/or AE instruction;Therapeutic activities;Patient/family education;Balance training  AM-PAC OT "6 Clicks" Daily Activity Outcome Measure (Version 2)  Help from another person eating meals? 4   Help from another person taking care of personal grooming? 3  Help from another person toileting, which includes using toliet, bedpan, or urinal? 3  Help from another person bathing (including washing, rinsing, drying)? 3  Help from another person to put on and taking off regular upper body clothing? 3  Help from another person to put on and taking off regular lower body clothing? 3  6 Click Score 19  OT Recommendation  Follow Up Recommendations Home health OT;Supervision/Assistance - 24 hour  OT Equipment 3 in 1 bedside commode (for use in shower)  Acute Rehab OT Goals  Patient Stated Goal to go home  Time For Goal Achievement 03/20/19  Potential to Achieve Goals Good  OT Time Calculation  OT Start Time (ACUTE ONLY) 0848  OT Stop Time (ACUTE ONLY) 0927  OT Time Calculation (min) 39 min  OT General Charges  $OT Visit 1 Visit  OT Evaluation  $OT Eval Moderate Complexity 1 Mod  OT Treatments  $Self Care/Home Management  8-22 mins  Written Expression  Dominant Hand Right    Mauri Brooklyn OTR/L [phone]

## 2019-03-06 NOTE — Progress Notes (Signed)
Patient arrived to unit via wheelchair. A&O x4. No complaints of pain or shortness of breath. Patient on room air, vital signs stable. Patient in bed, call light in reach, bed alarm on. Educated to call for assistance and demonstrated use of call light.

## 2019-03-06 NOTE — Evaluation (Signed)
Physical Therapy Evaluation Patient Details Name: Johnny Compton MRN: [id number] DOB: 1940/11/15 Today's Date: 03/06/2019   History of Present Illness  Pt is a 79 y.o. male with recent COVID-19 dx (03/02/19) but not hypoxic and sent home, now admitted 03/04/19 with acute respiratory failure with hypoxia secondary to SARS-CoV-2. PMH includes dementia, asthma, HTN.    Clinical Impression  Pt presents with an overall decrease in functional mobility secondary to above. PTA, pt independent, active and lives with wife. Today, pt able to initiate transfer and gait training with intermittent minA for HHA; limited by generalized weakness and decreased activity tolerance. SpO2 maintaining 92-100% on RA. Pt would benefit from continued acute PT services to maximize functional mobility and independence prior to d/c home.     Follow Up Recommendations No PT follow up;Supervision for mobility/OOB    Equipment Recommendations  None recommended by PT    Recommendations for Other Services       Precautions / Restrictions Precautions Precautions: Fall Restrictions Weight Bearing Restrictions: No      Mobility  Bed Mobility               General bed mobility comments: Received on BSC  Transfers Overall transfer level: Needs assistance Equipment used: 1 person hand held assist Transfers: Sit to/from Stand Sit to Stand: Min assist;Mod assist         General transfer comment: Min-modA for HHA to stand from [facility] and EOB  Ambulation/Gait Ambulation/Gait assistance: Min guard Gait Distance (Feet): 8 Feet Assistive device: 1 person hand held assist;None   Gait velocity: Decreased Gait velocity interpretation: <1.8 ft/sec, indicate of risk for recurrent falls General Gait Details: Slow, mildly unsteady gait with intermittent HHA for stability, close min guard for balance; prolonged standing at sink to complete ADL tasks without seated rest  Stairs            Wheelchair Mobility     Modified Rankin (Stroke Patients Only)       Balance Overall balance assessment: Needs assistance Sitting-balance support: No upper extremity supported;Feet supported Sitting balance-Leahy Scale: Fair     Standing balance support: No upper extremity supported;During functional activity Standing balance-Leahy Scale: Fair Standing balance comment: Able to static stand at sink without UE support; dynamic balance improved performing ADL tasks with at least single UE support                             Pertinent Vitals/Pain Pain Assessment: 0-10 Pain Score: 1  Pain Location: Chest Pain Descriptors / Indicators: Aching Pain Intervention(s): Monitored during session    Home Living Family/patient expects to be discharged to:: Private residence Living Arrangements: Spouse/significant other;Children Available Help at Discharge: Family(Wife, son, and daughter can assist) Type of Home: House Home Access: Level entry     Home Layout: One level Home Equipment: Grab bars - tub/shower;Walker - 4 wheels      Prior Function Level of Independence: Independent         Comments: Pt independent in ADLs and mobility. Pt reports ambulating without an assistive device and has had 2 falls in the last 6 months. Wife completes IADLs. Pt still drives. Pt does not use oxygen at home. Enjoys Haematologist, uses riding Radiation protection practitioner   Dominant Hand: Right    Extremity/Trunk Assessment   Upper Extremity Assessment Upper Extremity Assessment: LUE deficits/detail;Generalized weakness(RUE ROM WFL.) LUE Deficits / Details: LUE shld flex ~45 degrees.  Elbow, wrist, and digit ROM WFL.    Lower Extremity Assessment Lower Extremity Assessment: Generalized weakness    Cervical / Trunk Assessment Cervical / Trunk Assessment: Kyphotic  Communication   Communication: No difficulties  Cognition Arousal/Alertness: Awake/alert Behavior During Therapy: WFL for tasks  assessed/performed Overall Cognitive Status: Within Functional Limits for tasks assessed                                 General Comments: WFL for simple tasks, not formally assessed      General Comments General comments (skin integrity, edema, etc.): SpO2 100% on 3L O2 [place]; maintaining 92-100% on RA    Exercises     Assessment/Plan    PT Assessment Patient needs continued PT services  PT Problem List Decreased strength;Decreased activity tolerance;Decreased balance;Decreased mobility;Decreased knowledge of use of DME;Cardiopulmonary status limiting activity       PT Treatment Interventions DME instruction;Gait training;Stair training;Functional mobility training;Therapeutic activities;Therapeutic exercise;Balance training;Patient/family education    PT Goals (Current goals can be found in the Care Plan section)  Acute Rehab PT Goals Patient Stated Goal: Get strength back PT Goal Formulation: With patient Time For Goal Achievement: 03/20/19 Potential to Achieve Goals: Good    Frequency Min 3X/week   Barriers to discharge        Co-evaluation PT/OT/SLP Co-Evaluation/Treatment: Yes Reason for Co-Treatment: For patient/therapist safety;To address functional/ADL transfers PT goals addressed during session: Mobility/safety with mobility;Balance         AM-PAC PT "6 Clicks" Mobility  Outcome Measure Help needed turning from your back to your side while in a flat bed without using bedrails?: None Help needed moving from lying on your back to sitting on the side of a flat bed without using bedrails?: A Little Help needed moving to and from a bed to a chair (including a wheelchair)?: A Little Help needed standing up from a chair using your arms (e.g., wheelchair or bedside chair)?: A Little Help needed to walk in hospital room?: A Little Help needed climbing 3-5 steps with a railing? : A Little 6 Click Score: 19    End of Session   Activity Tolerance:  Patient tolerated treatment well Patient left: in chair;with call bell/phone within reach;with chair alarm set Nurse Communication: Mobility status PT Visit Diagnosis: Other abnormalities of gait and mobility (R26.89);Muscle weakness (generalized) (M62.81)    Time: [id number] PT Time Calculation (min) (ACUTE ONLY): 27 min   Charges:   PT Evaluation $PT Eval Moderate Complexity: 1 Mod     Mabeline Caras, PT, DPT Acute Rehabilitation Services  Pager [phone] Office [place] 03/06/2019, 11:01 AM

## 2019-03-06 NOTE — Plan of Care (Signed)
Plan of Care reviewed. 

## 2019-03-07 ENCOUNTER — Inpatient Hospital Stay (HOSPITAL_COMMUNITY): Payer: Medicare HMO

## 2019-03-07 LAB — CBC WITH DIFFERENTIAL/PLATELET
Abs Immature Granulocytes: 0.08 10*3/uL — ABNORMAL HIGH (ref 0.00–0.07)
Basophils Absolute: 0.1 10*3/uL (ref 0.0–0.1)
Basophils Relative: 1 %
Eosinophils Absolute: 0 10*3/uL (ref 0.0–0.5)
Eosinophils Relative: 0 %
HCT: 46.5 % (ref 39.0–52.0)
Hemoglobin: 16 g/dL (ref 13.0–17.0)
Immature Granulocytes: 1 %
Lymphocytes Relative: 24 %
Lymphs Abs: 2.2 10*3/uL (ref 0.7–4.0)
MCH: 31.2 pg (ref 26.0–34.0)
MCHC: 34.4 g/dL (ref 30.0–36.0)
MCV: 90.6 fL (ref 80.0–100.0)
Monocytes Absolute: 0.5 10*3/uL (ref 0.1–1.0)
Monocytes Relative: 5 %
Neutro Abs: 6.4 10*3/uL (ref 1.7–7.7)
Neutrophils Relative %: 69 %
Platelets: 240 10*3/uL (ref 150–400)
RBC: 5.13 MIL/uL (ref 4.22–5.81)
RDW: 14.7 % (ref 11.5–15.5)
WBC: 9.2 10*3/uL (ref 4.0–10.5)
nRBC: 0 % (ref 0.0–0.2)

## 2019-03-07 LAB — COMPREHENSIVE METABOLIC PANEL
ALT: 26 U/L (ref 0–44)
AST: 33 U/L (ref 15–41)
Albumin: 3 g/dL — ABNORMAL LOW (ref 3.5–5.0)
Alkaline Phosphatase: 50 U/L (ref 38–126)
Anion gap: 10 (ref 5–15)
BUN: 16 mg/dL (ref 8–23)
CO2: 23 mmol/L (ref 22–32)
Calcium: 8.5 mg/dL — ABNORMAL LOW (ref 8.9–10.3)
Chloride: 101 mmol/L (ref 98–111)
Creatinine, Ser: 0.67 mg/dL (ref 0.61–1.24)
GFR calc Af Amer: >60 mL/min (ref >60)
GFR calc non Af Amer: >60 mL/min (ref >60)
Glucose, Bld: 115 mg/dL — ABNORMAL HIGH (ref 70–99)
Potassium: 3.9 mmol/L (ref 3.5–5.1)
Sodium: 134 mmol/L — ABNORMAL LOW (ref 135–145)
Total Bilirubin: 0.9 mg/dL (ref 0.3–1.2)
Total Protein: 6.4 g/dL — ABNORMAL LOW (ref 6.5–8.1)

## 2019-03-07 LAB — FERRITIN: Ferritin: 368 ng/mL — ABNORMAL HIGH (ref 24–336)

## 2019-03-07 LAB — D-DIMER, QUANTITATIVE: D-Dimer, Quant: 0.75 ug/mL-FEU — ABNORMAL HIGH (ref 0.00–0.50)

## 2019-03-07 LAB — C-REACTIVE PROTEIN: CRP: 1.2 mg/dL — ABNORMAL HIGH (ref <1.0)

## 2019-03-07 MED ORDER — FAMOTIDINE 20 MG PO TABS
20.0000 mg | ORAL_TABLET | Freq: Every day | ORAL | Status: DC
  Administered 2019-03-07 – 2019-03-08 (×2): 20 mg via ORAL
  Filled 2019-03-07 (×2): qty 1

## 2019-03-07 MED ORDER — IBUPROFEN 400 MG PO TABS
400.0000 mg | ORAL_TABLET | Freq: Three times a day (TID) | ORAL | Status: DC
  Administered 2019-03-07 – 2019-03-08 (×3): 400 mg via ORAL
  Filled 2019-03-07 (×6): qty 1

## 2019-03-07 MED ORDER — HYDROCOD POLST-CPM POLST ER 10-8 MG/5ML PO SUER
5.0000 mL | Freq: Two times a day (BID) | ORAL | Status: DC
  Administered 2019-03-07 – 2019-03-08 (×2): 5 mL via ORAL
  Filled 2019-03-07 (×2): qty 5

## 2019-03-07 MED ORDER — METHOCARBAMOL 500 MG PO TABS
500.0000 mg | ORAL_TABLET | Freq: Three times a day (TID) | ORAL | Status: DC | PRN
  Administered 2019-03-07 – 2019-03-08 (×2): 500 mg via ORAL
  Filled 2019-03-07 (×3): qty 1

## 2019-03-07 NOTE — Progress Notes (Signed)
   03/07/19 1400  Family/Significant Other Communication  Family/Significant Other Update Updated;Called (wife [place] updated)

## 2019-03-07 NOTE — Progress Notes (Addendum)
Physical Therapy Treatment Patient Details Name: Johnny Compton MRN: [id number] DOB: 12/14/1940 Today's Date: 03/07/2019    History of Present Illness Pt is a 79 y.o. male with recent COVID-19 dx (03/02/19) but not hypoxic and sent home, now admitted 03/04/19 with acute respiratory failure with hypoxia secondary to SARS-CoV-2. PMH includes dementia, asthma, HTN.    PT Comments    The patient improved in his balance with increased distance ambulating but still requires someone beside him. Patient ambulalted x 300' I with RW. Encouraged patient to use his RW when home. Recommend HHPT for balance and  Safety in his home  . Patient's SPO2 post ambu;ation 96%, could pull 1250 ML on IS x 10.  Follow Up Recommendations  Home health PT     Equipment Recommendations  None recommended by PT    Recommendations for Other Services       Precautions / Restrictions Precautions Precautions: Fall    Mobility  Bed Mobility Overal bed mobility: Needs Assistance Bed Mobility: Supine to Sit;Sit to Supine     Supine to sit: Supervision Sit to supine: Min assist   General bed mobility comments: min assist with legs onto bed  Transfers Overall transfer level: Needs assistance Equipment used: Rolling walker (2 wheeled) Transfers: Sit to/from Stand   Stand pivot transfers: Min assist       General transfer comment: steady assist to rise from bed.  Ambulation/Gait Ambulation/Gait assistance: Min guard;Min assist Gait Distance (Feet): 300 Feet(in room x 10 trips) Assistive device: Rolling walker (2 wheeled) Gait Pattern/deviations: Step-to pattern;Step-through pattern;Drifts right/left Gait velocity: Decreased Gait velocity interpretation: <1.8 ft/sec, indicate of risk for recurrent falls General Gait Details: steady assist initially, improved with distance. patient would pick up the Rw  when he turned around. cues for safty   Stairs             Wheelchair Mobility    Modified  Rankin (Stroke Patients Only)       Balance Overall balance assessment: Needs assistance Sitting-balance support: No upper extremity supported;Feet supported Sitting balance-Leahy Scale: Fair     Standing balance support: Bilateral upper extremity supported;During functional activity Standing balance-Leahy Scale: Fair Standing balance comment: would be poor if no UE support                            Cognition Arousal/Alertness: Awake/alert Behavior During Therapy: WFL for tasks assessed/performed Overall Cognitive Status: No family/caregiver present to determine baseline cognitive functioning                                 General Comments: Cues to initiate tasks.       Exercises      General Comments        Pertinent Vitals/Pain Pain Assessment: Faces Faces Pain Scale: Hurts even more Pain Location: back Pain Descriptors / Indicators: Aching Pain Intervention(s): Monitored during session;Repositioned    Home Living                      Prior Function            PT Goals (current goals can now be found in the care plan section) Progress towards PT goals: Progressing toward goals    Frequency    Min 3X/week      PT Plan Current plan remains appropriate;Discharge plan needs to be updated  Co-evaluation              AM-PAC PT "6 Clicks" Mobility   Outcome Measure  Help needed turning from your back to your side while in a flat bed without using bedrails?: A Little Help needed moving from lying on your back to sitting on the side of a flat bed without using bedrails?: A Little Help needed moving to and from a bed to a chair (including a wheelchair)?: A Little Help needed standing up from a chair using your arms (e.g., wheelchair or bedside chair)?: A Little Help needed to walk in hospital room?: A Little Help needed climbing 3-5 steps with a railing? : A Lot 6 Click Score: 17    End of Session Equipment  Utilized During Treatment: Gait belt Activity Tolerance: Patient tolerated treatment well Patient left: in bed;with call bell/phone within reach Nurse Communication: Mobility status PT Visit Diagnosis: Other abnormalities of gait and mobility (R26.89);Muscle weakness (generalized) (M62.81)     Time: [id number] PT Time Calculation (min) (ACUTE ONLY): 28 min  Charges:  $Gait Training: 23-37 mins                     [place] Pager [phone] Office [phone]    Claretha Cooper 03/07/2019, 5:37 PM

## 2019-03-07 NOTE — Progress Notes (Addendum)
PROGRESS NOTE    Johnny Compton  [id number] DOB: 18-Dec-1940 DOA: 03/04/2019 PCP: [name], MD    Brief Narrative:  79 year old male who presented with dyspnea.  He does have significant past medical history for dementia, asthma and hypertension.  Reported 7 days of upper respiratory tract infection symptoms, on January 22 he was diagnosed with COVID-19, she was instructed to stay at home and follow supportive medical therapy.  The following day he returned to the emergency department due to worsening dyspnea, his oximetry was in the 80s on room air, his temperature was 102 F, his lungs had bilateral rales at bases, his abdomen was soft, no lower extremity edema.  His chest radiograph was reported to have bilateral infiltrates.    He was transferred from [facility] to [place] on January 24, with the working diagnosis of acute hypoxic respiratory failure due to SARS COVID-19 viral pneumonia.  Has received medical therapy with remdesivir and systemic corticosteroids.  He symptoms have been improving   Assessment & Plan:   Principal Problem:   Acute hypoxemic respiratory failure due to severe acute respiratory syndrome coronavirus 2 (SARS-CoV-2) disease (HCC) Active Problems:   GERD (gastroesophageal reflux disease)   Essential hypertension   Dementia (Johnny Compton)   1.  Acute hypoxic respiratory failure due to SARS COVID-19 viral pneumonia.   RR: 18  Pulse oxymetry: 94%  Fi02: 21% room air.  COVID-19 Labs  Recent Labs    03/05/19 0353 03/06/19 0400 03/07/19 0618  DDIMER 1.08* 0.86* 0.75*  FERRITIN 370* 412* 368*  CRP 11.4* 4.4* 1.2*    No results found for: Caroline therapy with Remdesivir and systemic corticosteroids with dexamethasone. Continue antitussive agents, bronchodilators and airway clearing techniques with flutter valve and incentive spirometer.   Follow up chest film personally reviewed noted bilateral peripheral lowe  zone upper lobes interstitial infiltrates. No features of bacterial pneumonia, will dc antibiotic therapy.   2. Acute on chronic back pain. Will continue analgesia with acetaminophen and will add ibuprofen tid. Out of bed as tolerated and physical/ occupation therapy.  3. HTN. Continue blood pressure monitoring  4. Dementia. Continue donepezil and memantine, no agitation. Continue amitriptyline at night.   5. Hypothyroid continue with levothyroxine.    DVT prophylaxis: enoxaparin   Code Status:  full Family Communication: no family at the bedside  Disposition Plan/ discharge barriers: possible dc in am, after completion of remdesivir therapy.     Subjective: Patient with positive back pain, worse with movement, 10/10 intensity sharp in nature with no radiation. No nausea or vomiting. Dyspnea has been improving.   Objective: Vitals:   03/06/19 1513 03/06/19 2000 03/07/19 0400 03/07/19 0741  BP: 111/77 115/83 120/85 120/79  Pulse: 95 81 78 96  Resp: (!) 21 17 16 18   Temp: 97.8 F (36.6 C) 98.5 F (36.9 C) 98 F (36.7 C) 97.7 F (36.5 C)  TempSrc: Oral Oral Oral Oral  SpO2: 97% 95% 96% 94%  Weight:      Height:        Intake/Output Summary (Last 24 hours) at 03/07/2019 0933 Last data filed at 03/07/2019 0528 Gross per 24 hour  Intake 260 ml  Output 1400 ml  Net -1140 ml   Filed Weights   03/04/19 1630  Weight: 119 kg    Examination:   General: Not in pain or dyspnea, deconditioned and in pain.  Neurology: Awake and alert, non focal  E ENT: mild pallor, no icterus, oral mucosa  moist Cardiovascular: No JVD. S1-S2 present, rhythmic, no gallops, rubs, or murmurs. No lower extremity edema. Pulmonary: positive breath sounds bilaterally, adequate air movement, no wheezing, rhonchi or rales. Gastrointestinal. Abdomen with no organomegaly, non tender, no rebound or guarding Skin. No rashes Musculoskeletal: no joint deformities     Data Reviewed: I have personally  reviewed following labs and imaging studies  CBC: Recent Labs  Lab 03/04/19 1030 03/05/19 0353 03/06/19 0400 03/07/19 0618  WBC 17.8* 15.0* 12.5* 9.2  NEUTROABS 15.9* 13.0* 10.4* 6.4  HGB 15.9 16.1 15.2 16.0  HCT 46.4 46.8 45.0 46.5  MCV 93.2 93.0 92.4 90.6  PLT 153 160 202 [id number]   Basic Metabolic Panel: Recent Labs  Lab 03/04/19 1030 03/05/19 0353 03/06/19 0400 03/07/19 0618  NA 136 137 132* 134*  K 3.9 4.5 4.1 3.9  CL 104 105 100 101  CO2 23 23 23 23   GLUCOSE 154* 126* 117* 115*  BUN 9 12 15 16   CREATININE 0.70 0.57* 0.58* 0.67  CALCIUM 8.2* 8.7* 8.4* 8.5*   GFR: Estimated Creatinine Clearance: 102.6 mL/min (by C-G formula based on SCr of 0.67 mg/dL). Liver Function Tests: Recent Labs  Lab 03/04/19 1030 03/05/19 0353 03/06/19 0400 03/07/19 0618  AST 43* 39 30 33  ALT 22 21 19 26   ALKPHOS 50 53 52 50  BILITOT 0.6 0.5 0.7 0.9  PROT 6.3* 6.4* 6.1* 6.4*  ALBUMIN 3.0* 3.0* 2.9* 3.0*   No results for input(s): LIPASE, AMYLASE in the last 168 hours. No results for input(s): AMMONIA in the last 168 hours. Coagulation Profile: No results for input(s): INR, PROTIME in the last 168 hours. Cardiac Enzymes: No results for input(s): CKTOTAL, CKMB, CKMBINDEX, TROPONINI in the last 168 hours. BNP (last 3 results) No results for input(s): PROBNP in the last 8760 hours. HbA1C: No results for input(s): HGBA1C in the last 72 hours. CBG: No results for input(s): GLUCAP in the last 168 hours. Lipid Profile: No results for input(s): CHOL, HDL, LDLCALC, TRIG, CHOLHDL, LDLDIRECT in the last 72 hours. Thyroid Function Tests: No results for input(s): TSH, T4TOTAL, FREET4, T3FREE, THYROIDAB in the last 72 hours. Anemia Panel: Recent Labs    03/06/19 0400 03/07/19 0618  FERRITIN 412* 368*      Radiology Studies: I have reviewed all of the imaging during this hospital visit personally     Scheduled Meds: . amitriptyline  100 mg Oral QHS  . vitamin C  500 mg Oral  Daily  . azithromycin  500 mg Oral Daily  . dexamethasone  6 mg Oral Q24H  . donepezil  10 mg Oral QHS  . enoxaparin (LOVENOX) injection  40 mg Subcutaneous Q24H  . fluticasone  2 spray Each Nare Daily  . Ipratropium-Albuterol  1 puff Inhalation Q6H  . levothyroxine  125 mcg Oral QAC breakfast  . memantine  10 mg Oral Daily  . mometasone-formoterol  2 puff Inhalation BID  . pantoprazole  40 mg Oral Daily  . polyethylene glycol  17 g Oral Daily  . senna-docusate  1 tablet Oral BID  . zinc sulfate  220 mg Oral Daily   Continuous Infusions: . cefTRIAXone (ROCEPHIN)  IV 1 g (03/06/19 1612)  . remdesivir 100 mg in NS 100 mL 100 mg (03/07/19 0904)     LOS: 3 days        Dagan Heinz Gerome Apley, MD

## 2019-03-07 NOTE — Progress Notes (Addendum)
1900 Report from day RN.  [phone] Epic text sent to MD for pt request of pain or muscle spasm medication for his back. Pt moved from bed to chair to help reposition. Call bell in reach.   2015 Pt assisted back to bed. Condom cath placed.

## 2019-03-08 DIAGNOSIS — M549 Dorsalgia, unspecified: Secondary | ICD-10-CM

## 2019-03-08 DIAGNOSIS — U071 COVID-19: Secondary | ICD-10-CM

## 2019-03-08 DIAGNOSIS — E039 Hypothyroidism, unspecified: Secondary | ICD-10-CM

## 2019-03-08 DIAGNOSIS — G8929 Other chronic pain: Secondary | ICD-10-CM

## 2019-03-08 DIAGNOSIS — J1282 Pneumonia due to coronavirus disease 2019: Secondary | ICD-10-CM

## 2019-03-08 DIAGNOSIS — M545 Low back pain: Secondary | ICD-10-CM

## 2019-03-08 HISTORY — DX: Pneumonia due to coronavirus disease 2019: J12.82

## 2019-03-08 HISTORY — DX: Dorsalgia, unspecified: M54.9

## 2019-03-08 HISTORY — DX: COVID-19: U07.1

## 2019-03-08 HISTORY — DX: Hypothyroidism, unspecified: E03.9

## 2019-03-08 LAB — COMPREHENSIVE METABOLIC PANEL
ALT: 44 U/L (ref 0–44)
AST: 37 U/L (ref 15–41)
Albumin: 3.1 g/dL — ABNORMAL LOW (ref 3.5–5.0)
Alkaline Phosphatase: 47 U/L (ref 38–126)
Anion gap: 10 (ref 5–15)
BUN: 20 mg/dL (ref 8–23)
CO2: 26 mmol/L (ref 22–32)
Calcium: 8.6 mg/dL — ABNORMAL LOW (ref 8.9–10.3)
Chloride: 98 mmol/L (ref 98–111)
Creatinine, Ser: 0.83 mg/dL (ref 0.61–1.24)
GFR calc Af Amer: >60 mL/min (ref >60)
GFR calc non Af Amer: >60 mL/min (ref >60)
Glucose, Bld: 112 mg/dL — ABNORMAL HIGH (ref 70–99)
Potassium: 4.5 mmol/L (ref 3.5–5.1)
Sodium: 134 mmol/L — ABNORMAL LOW (ref 135–145)
Total Bilirubin: 1 mg/dL (ref 0.3–1.2)
Total Protein: 6.5 g/dL (ref 6.5–8.1)

## 2019-03-08 LAB — D-DIMER, QUANTITATIVE: D-Dimer, Quant: 0.71 ug/mL-FEU — ABNORMAL HIGH (ref 0.00–0.50)

## 2019-03-08 LAB — C-REACTIVE PROTEIN: CRP: 0.7 mg/dL (ref <1.0)

## 2019-03-08 LAB — FERRITIN: Ferritin: 371 ng/mL — ABNORMAL HIGH (ref 24–336)

## 2019-03-08 MED ORDER — GUAIFENESIN-DM 100-10 MG/5ML PO SYRP: 5.0000 mL | ORAL_SOLUTION | Freq: Four times a day (QID) | ORAL | 0 refills | Status: DC | PRN

## 2019-03-08 MED ORDER — ACETAMINOPHEN 325 MG PO TABS: 650.0000 mg | ORAL_TABLET | Freq: Four times a day (QID) | ORAL | 0 refills | Status: AC | PRN

## 2019-03-08 NOTE — TOC Transition Note (Signed)
Transition of Care [facility]) - CM/SW Discharge Note   Patient Details  Name: Johnny Compton MRN: [id number] Date of Birth: 07-07-1940  [facility] CM/SW Contact:  Shade Flood, LCSW Phone Number: 03/08/2019, 11:27 AM   Clinical Narrative:     Pt stable for dc per MD. Damaris Schooner with pt who is agreeable to [facility] services and who states he needs transport home.  Arranged HH with [facility]. EMS transport form completed. RN NT will assist in calling [facility] when RN/pt ready.  There are no other TOC needs for dc.   Final next level of care: [place] Barriers to Discharge: Barriers Resolved   Patient Goals and CMS Choice        Discharge Placement                       Discharge Plan and Services                          HH Arranged: RN, PT, OT [facility] Agency: [place] Date [place]: 03/08/19   Representative spoke with at [place]: [place] ([place]) Interventions     Readmission Risk Interventions No flowsheet data found.

## 2019-03-08 NOTE — Plan of Care (Signed)
  Problem: Education: Goal: Knowledge of General Education information will improve Description: Including pain rating scale, medication(s)/side effects and non-pharmacologic comfort measures Outcome: Adequate for Discharge   Problem: Health Behavior/Discharge Planning: Goal: Ability to manage health-related needs will improve Outcome: Adequate for Discharge   Problem: Clinical Measurements: Goal: Ability to maintain clinical measurements within normal limits will improve Outcome: Adequate for Discharge Goal: Will remain free from infection Outcome: Adequate for Discharge Goal: Diagnostic test results will improve Outcome: Adequate for Discharge Goal: Respiratory complications will improve Outcome: Adequate for Discharge Goal: Cardiovascular complication will be avoided Outcome: Adequate for Discharge   Problem: Activity: Goal: Risk for activity intolerance will decrease Outcome: Adequate for Discharge   Problem: Nutrition: Goal: Adequate nutrition will be maintained Outcome: Adequate for Discharge   Problem: Coping: Patient discharged home via stretcher, accompanied by [facility] staff. Discharge teaching completed and included: new medication regimen, follow-up appointment information, isolation and covid 19 instructions, and signs & symptoms to report/return with immediately. Patient verbalizes understanding and asks appropriate questions. Spouse updated on discharge disposition also with all questions answered. Patient will receive home health/PT. PIV removed with catheter intact.     Goal: Level of anxiety will decrease Outcome: Adequate for Discharge   Problem: Elimination: Goal: Will not experience complications related to bowel motility Outcome: Adequate for Discharge Goal: Will not experience complications related to urinary retention Outcome: Adequate for Discharge   Problem: Pain Managment: Goal: General experience of comfort will improve Outcome: Adequate for  Discharge   Problem: Safety: Goal: Ability to remain free from injury will improve Outcome: Adequate for Discharge   Problem: Skin Integrity: Goal: Risk for impaired skin integrity will decrease Outcome: Adequate for Discharge

## 2019-03-08 NOTE — Discharge Instructions (Signed)
COVID-19 COVID-19 is a respiratory infection that is caused by a virus called severe acute respiratory syndrome coronavirus 2 (SARS-CoV-2). The disease is also known as coronavirus disease or novel coronavirus. In some people, the virus may not cause any symptoms. In others, it may cause a serious infection. The infection can get worse quickly and can lead to complications, such as:  Pneumonia, or infection of the lungs.  Acute respiratory distress syndrome or ARDS. This is a condition in which fluid build-up in the lungs prevents the lungs from filling with air and passing oxygen into the blood.  Acute respiratory failure. This is a condition in which there is not enough oxygen passing from the lungs to the body or when carbon dioxide is not passing from the lungs out of the body.  Sepsis or septic shock. This is a serious bodily reaction to an infection.  Blood clotting problems.  Secondary infections due to bacteria or fungus.  Organ failure. This is when your body's organs stop working. The virus that causes COVID-19 is contagious. This means that it can spread from person to person through droplets from coughs and sneezes (respiratory secretions). What are the causes? This illness is caused by a virus. You may catch the virus by:  Breathing in droplets from an infected person. Droplets can be spread by a person breathing, speaking, singing, coughing, or sneezing.  Touching something, like a table or a doorknob, that was exposed to the virus (contaminated) and then touching your mouth, nose, or eyes. What increases the risk? Risk for infection You are more likely to be infected with this virus if you:  Are within 6 feet (2 meters) of a person with COVID-19.  Provide care for or live with a person who is infected with COVID-19.  Spend time in crowded indoor spaces or live in shared housing. Risk for serious illness You are more likely to become seriously ill from the virus if you:   Are 50 years of age or older. The higher your age, the more you are at risk for serious illness.  Live in a nursing home or long-term care facility.  Have cancer.  Have a long-term (chronic) disease such as: ? Chronic lung disease, including chronic obstructive pulmonary disease or asthma. ? A long-term disease that lowers your body's ability to fight infection (immunocompromised). ? Heart disease, including heart failure, a condition in which the arteries that lead to the heart become narrow or blocked (coronary artery disease), a disease which makes the heart muscle thick, weak, or stiff (cardiomyopathy). ? Diabetes. ? Chronic kidney disease. ? Sickle cell disease, a condition in which red blood cells have an abnormal "sickle" shape. ? Liver disease.  Are obese. What are the signs or symptoms? Symptoms of this condition can range from mild to severe. Symptoms may appear any time from 2 to 14 days after being exposed to the virus. They include:  A fever or chills.  A cough.  Difficulty breathing.  Headaches, body aches, or muscle aches.  Runny or stuffy (congested) nose.  A sore throat.  New loss of taste or smell. Some people may also have stomach problems, such as nausea, vomiting, or diarrhea. Other people may not have any symptoms of COVID-19. How is this diagnosed? This condition may be diagnosed based on:  Your signs and symptoms, especially if: ? You live in an area with a COVID-19 outbreak. ? You recently traveled to or from an area where the virus is common. ? You   provide care for or live with a person who was diagnosed with COVID-19. ? You were exposed to a person who was diagnosed with COVID-19.  A physical exam.  Lab tests, which may include: ? Taking a sample of fluid from the back of your nose and throat (nasopharyngeal fluid), your nose, or your throat using a swab. ? A sample of mucus from your lungs (sputum). ? Blood tests.  Imaging tests, which  may include, X-rays, CT scan, or ultrasound. How is this treated? At present, there is no medicine to treat COVID-19. Medicines that treat other diseases are being used on a trial basis to see if they are effective against COVID-19. Your health care provider will talk with you about ways to treat your symptoms. For most people, the infection is mild and can be managed at home with rest, fluids, and over-the-counter medicines. Treatment for a serious infection usually takes places in a hospital intensive care unit (ICU). It may include one or more of the following treatments. These treatments are given until your symptoms improve.  Receiving fluids and medicines through an IV.  Supplemental oxygen. Extra oxygen is given through a tube in the nose, a face mask, or a hood.  Positioning you to lie on your stomach (prone position). This makes it easier for oxygen to get into the lungs.  Continuous positive airway pressure (CPAP) or bi-level positive airway pressure (BPAP) machine. This treatment uses mild air pressure to keep the airways open. A tube that is connected to a motor delivers oxygen to the body.  Ventilator. This treatment moves air into and out of the lungs by using a tube that is placed in your windpipe.  Tracheostomy. This is a procedure to create a hole in the neck so that a breathing tube can be inserted.  Extracorporeal membrane oxygenation (ECMO). This procedure gives the lungs a chance to recover by taking over the functions of the heart and lungs. It supplies oxygen to the body and removes carbon dioxide. Follow these instructions at home: Lifestyle  If you are sick, stay home except to get medical care. Your health care provider will tell you how long to stay home. Call your health care provider before you go for medical care.  Rest at home as told by your health care provider.  Do not use any products that contain nicotine or tobacco, such as cigarettes, e-cigarettes, and  chewing tobacco. If you need help quitting, ask your health care provider.  Return to your normal activities as told by your health care provider. Ask your health care provider what activities are safe for you. General instructions  Take over-the-counter and prescription medicines only as told by your health care provider.  Drink enough fluid to keep your urine pale yellow.  Keep all follow-up visits as told by your health care provider. This is important. How is this prevented?  There is no vaccine to help prevent COVID-19 infection. However, there are steps you can take to protect yourself and others from this virus. To protect yourself:   Do not travel to areas where COVID-19 is a risk. The areas where COVID-19 is reported change often. To identify high-risk areas and travel restrictions, check the CDC travel website: wwwnc.cdc.gov/travel/notices  If you live in, or must travel to, an area where COVID-19 is a risk, take precautions to avoid infection. ? Stay away from people who are sick. ? Wash your hands often with soap and water for 20 seconds. If soap and water   are not available, use an alcohol-based hand sanitizer. ? Avoid touching your mouth, face, eyes, or nose. ? Avoid going out in public, follow guidance from your state and local health authorities. ? If you must go out in public, wear a cloth face covering or face mask. Make sure your mask covers your nose and mouth. ? Avoid crowded indoor spaces. Stay at least 6 feet (2 meters) away from others. ? Disinfect objects and surfaces that are frequently touched every day. This may include:  Counters and tables.  Doorknobs and light switches.  Sinks and faucets.  Electronics, such as phones, remote controls, keyboards, computers, and tablets. To protect others: If you have symptoms of COVID-19, take steps to prevent the virus from spreading to others.  If you think you have a COVID-19 infection, contact your health care  provider right away. Tell your health care team that you think you may have a COVID-19 infection.  Stay home. Leave your house only to seek medical care. Do not use public transport.  Do not travel while you are sick.  Wash your hands often with soap and water for 20 seconds. If soap and water are not available, use alcohol-based hand sanitizer.  Stay away from other members of your household. Let healthy household members care for children and pets, if possible. If you have to care for children or pets, wash your hands often and wear a mask. If possible, stay in your own room, separate from others. Use a different bathroom.  Make sure that all people in your household wash their hands well and often.  Cough or sneeze into a tissue or your sleeve or elbow. Do not cough or sneeze into your hand or into the air.  Wear a cloth face covering or face mask. Make sure your mask covers your nose and mouth. Where to find more information  Centers for Disease Control and Prevention: www.cdc.gov/coronavirus/2019-ncov/index.html  World Health Organization: www.who.int/health-topics/coronavirus Contact a health care provider if:  You live in or have traveled to an area where COVID-19 is a risk and you have symptoms of the infection.  You have had contact with someone who has COVID-19 and you have symptoms of the infection. Get help right away if:  You have trouble breathing.  You have pain or pressure in your chest.  You have confusion.  You have bluish lips and fingernails.  You have difficulty waking from sleep.  You have symptoms that get worse. These symptoms may represent a serious problem that is an emergency. Do not wait to see if the symptoms will go away. Get medical help right away. Call your local emergency services (911 in the [place] Do not drive yourself to the hospital. Let the emergency medical personnel know if you think you have COVID-19. Summary  COVID-19 is a  respiratory infection that is caused by a virus. It is also known as coronavirus disease or novel coronavirus. It can cause serious infections, such as pneumonia, acute respiratory distress syndrome, acute respiratory failure, or sepsis.  The virus that causes COVID-19 is contagious. This means that it can spread from person to person through droplets from breathing, speaking, singing, coughing, or sneezing.  You are more likely to develop a serious illness if you are 50 years of age or older, have a weak immune system, live in a nursing home, or have chronic disease.  There is no medicine to treat COVID-19. Your health care provider will talk with you about ways to treat your symptoms.    Take steps to protect yourself and others from infection. Wash your hands often and disinfect objects and surfaces that are frequently touched every day. Stay away from people who are sick and wear a mask if you are sick. This information is not intended to replace advice given to you by your health care provider. Make sure you discuss any questions you have with your health care provider. Document Revised: 11/24/2018 Document Reviewed: 03/02/2018 [employer]vier Patient Education  2020 [employer]  

## 2019-03-08 NOTE — Discharge Summary (Signed)
Physician Discharge Summary  CASIN SCHOLTES [id number] DOB: 1941-02-09 DOA: 03/04/2019  PCP: [name], MD  Admit date: 03/04/2019 Discharge date: 03/08/2019  Admitted From: Home  Disposition:  Home   Recommendations for Outpatient Follow-up and new medication changes:  1. Follow up with Dr. Rica Records in 2 weeks.  2. Continue quarantine for 2 weeks, use a mask in public and maintain physical distancing.   Home Health: yes   Equipment/Devices: no    Discharge Condition: stable  CODE STATUS: full  Diet recommendation: heart healthy   Brief/Interim Summary: 79 year old male who presented with dyspnea. He does have significant past medical history for dementia, asthma and hypertension. Reported 7 days of upper respiratory tract infection symptoms. On January 22 he went to the ED, was diagnosed with COVID-19, and he was instructed to stay at home and follow supportive medical therapy. The following day he returned to the emergency department due to worsening dyspnea, his oximetry was in the 80s on room air, his temperature was 102 F,his lungs had bilateral ralesat bases, his abdomen was soft, no lower extremity edema.  Sodium 136, potassium 3.9, chloride 104, bicarb 23, glucose 154, BUN 9, creatinine 0.70, white count 17.8, hemoglobin 15.9, hematocrit 46.4, platelets 153. His chest radiograph was reported to have bilateral infiltrates.  He was transferred from [facility] to [place] on January 24,with the working diagnosis of acute hypoxic respiratory failure due to SARS COVID-19 viral pneumonia.  Has received medical therapy with remdesivir and systemic corticosteroids with improvement of his symptoms.   1.  Acute hypoxic respiratory failure due to SARS COVID-19 viral pneumonia.  Patient was admitted to the medical ward, he received supplemental oxygen per nasal cannula, medical therapy with remdesivir and systemic corticosteroids.  He was treated with  bronchodilators, antitussive agents and airway clearing techniques with incentive spirometer and flutter valve.  His follow-up chest radiograph showed bilateral peripheral zone upper lobes interstitial infiltrates.  No signs of bacterial pneumonia. He symptoms and inflammatory markers improved.  His oxygen saturation at discharge is 99% on room air.  2.  Acute on chronic back pain.  During his hospitalization he had acute exacerbation of his chronic back pain.  He received acetaminophen and ibuprofen.  Physical therapy recommended home health services.  3.  Hypertension.  His blood pressure minimal control.  Off antihypertensive agents.  4.  Dementia.  No agitation, patient was continued on donepezil, memantine and amitriptyline.  5.  Hypothyroidism.  Continue levothyroxine.     Discharge Diagnoses:  Principal Problem:   Pneumonia due to COVID-19 virus Active Problems:   Acute hypoxemic respiratory failure due to severe acute respiratory syndrome coronavirus 2 (SARS-CoV-2) disease (HCC)   GERD (gastroesophageal reflux disease)   Essential hypertension   Dementia (HCC)   Hypothyroid   Back pain    Discharge Instructions   Allergies as of 03/08/2019   No Known Allergies     Medication List    TAKE these medications   acetaminophen 325 MG tablet Commonly known as: TYLENOL Take 2 tablets (650 mg total) by mouth every 6 (six) hours as needed for moderate pain (back pain).   amitriptyline 100 MG tablet Commonly known as: ELAVIL Take 100 mg by mouth at bedtime.   budesonide-formoterol 160-4.5 MCG/ACT inhaler Commonly known as: SYMBICORT Inhale 2 puffs into the lungs 2 (two) times daily.   cetirizine 10 MG tablet Commonly known as: ZYRTEC Take 10 mg by mouth daily.   donepezil 10 MG tablet Commonly known as:  ARICEPT Take 10 mg by mouth at bedtime.   ergocalciferol 1.25 MG (50000 UT) capsule Commonly known as: VITAMIN D2 Take 50,000 Units by mouth every Wednesday.    esomeprazole 40 MG capsule Commonly known as: NEXIUM Take 40 mg by mouth daily before breakfast.   fluticasone 50 MCG/ACT nasal spray Commonly known as: FLONASE Place 1 spray into both nostrils 2 (two) times daily.   guaiFENesin-dextromethorphan 100-10 MG/5ML syrup Commonly known as: ROBITUSSIN DM Take 5 mLs by mouth every 6 (six) hours as needed for cough.   levothyroxine 125 MCG tablet Commonly known as: SYNTHROID Take 125 mcg by mouth daily before breakfast.   memantine 10 MG tablet Commonly known as: NAMENDA Take 10 mg by mouth at bedtime.   montelukast 10 MG tablet Commonly known as: SINGULAIR Take 10 mg by mouth daily.   nitroGLYCERIN 0.4 MG SL tablet Commonly known as: NITROSTAT Place 0.4 mg under the tongue every 5 (five) minutes as needed for chest pain.   ondansetron 4 MG disintegrating tablet Commonly known as: ZOFRAN-ODT Take 4 mg by mouth every 8 (eight) hours as needed for nausea or vomiting.   polyethylene glycol 17 g packet Commonly known as: MIRALAX / GLYCOLAX Take 17 g by mouth every evening.   Systane Balance 0.6 % Soln Generic drug: Propylene Glycol Place 1 drop into both eyes 3 (three) times daily as needed (dry/irritated eyes.).       No Known Allergies  Consultations:     Procedures/Studies: DG Chest 1 View  Result Date: 03/07/2019 CLINICAL DATA:  Dyspnea, COVID-19 positive, hypertension, dementia EXAM: CHEST  1 VIEW COMPARISON:  Portable exam 1039 hours compared to 03/03/2019 FINDINGS: Normal heart size, mediastinal contours, and pulmonary vascularity. Patchy interstitial infiltrates in the lungs bilaterally greater on RIGHT consistent with multifocal pneumonia. Slightly increased infiltrate in the lateral aspect of the mid to upper RIGHT lung. No pleural effusion or pneumothorax. Bones demineralized. IMPRESSION: BILATERAL pulmonary infiltrates consistent with multifocal pneumonia, slightly increased in periphery of RIGHT lung.  Electronically Signed   By: Lavonia Dana M.D.   On: 03/07/2019 10:57      Procedures:   Subjective: Patient is feeling better, dyspnea has improved, no chest pain, no nausea or vomiting. Patient is tolerating po well.  Back pain has improved.   Discharge Exam: Vitals:   03/07/19 2006 03/08/19 0757  BP: 125/84 117/82  Pulse: 86 82  Resp: 16 16  Temp: 98.2 F (36.8 C) 97.7 F (36.5 C)  SpO2: 90% 99%   Vitals:   03/07/19 0741 03/07/19 1623 03/07/19 2006 03/08/19 0757  BP: 120/79 132/69 125/84 117/82  Pulse: 96 88 86 82  Resp: 18 18 16 16   Temp: 97.7 F (36.5 C) 98.3 F (36.8 C) 98.2 F (36.8 C) 97.7 F (36.5 C)  TempSrc: Oral Oral Oral Axillary  SpO2: 94% 94% 90% 99%  Weight:      Height:        General: Not in pain or dyspnea. Neurology: Awake and alert, non focal  E ENT: no pallor, no icterus, oral mucosa moist Cardiovascular: No JVD. S1-S2 present, rhythmic, no gallops, rubs, or murmurs. No lower extremity edema. Pulmonary: positive breath sounds bilaterally. Gastrointestinal. Abdomen with no organomegaly, non tender, no rebound or guarding Skin. No rashes Musculoskeletal: no joint deformities   The results of significant diagnostics from this hospitalization (including imaging, microbiology, ancillary and laboratory) are listed below for reference.     Microbiology: No results found for this or any previous  visit (from the past 240 hour(s)).   Labs: BNP (last 3 results) Recent Labs    03/04/19 1030  BNP 123XX123*   Basic Metabolic Panel: Recent Labs  Lab 03/04/19 1030 03/05/19 0353 03/06/19 0400 03/07/19 0618 03/08/19 0618  NA 136 137 132* 134* 134*  K 3.9 4.5 4.1 3.9 4.5  CL 104 105 100 101 98  CO2 23 23 23 23 26   GLUCOSE 154* 126* 117* 115* 112*  BUN 9 12 15 16 20   CREATININE 0.70 0.57* 0.58* 0.67 0.83  CALCIUM 8.2* 8.7* 8.4* 8.5* 8.6*   Liver Function Tests: Recent Labs  Lab 03/04/19 1030 03/05/19 0353 03/06/19 0400 03/07/19 0618  03/08/19 0618  AST 43* 39 30 33 37  ALT 22 21 19 26  44  ALKPHOS 50 53 52 50 47  BILITOT 0.6 0.5 0.7 0.9 1.0  PROT 6.3* 6.4* 6.1* 6.4* 6.5  ALBUMIN 3.0* 3.0* 2.9* 3.0* 3.1*   No results for input(s): LIPASE, AMYLASE in the last 168 hours. No results for input(s): AMMONIA in the last 168 hours. CBC: Recent Labs  Lab 03/04/19 1030 03/05/19 0353 03/06/19 0400 03/07/19 0618  WBC 17.8* 15.0* 12.5* 9.2  NEUTROABS 15.9* 13.0* 10.4* 6.4  HGB 15.9 16.1 15.2 16.0  HCT 46.4 46.8 45.0 46.5  MCV 93.2 93.0 92.4 90.6  PLT 153 160 202 240   Cardiac Enzymes: No results for input(s): CKTOTAL, CKMB, CKMBINDEX, TROPONINI in the last 168 hours. BNP: Invalid input(s): POCBNP CBG: No results for input(s): GLUCAP in the last 168 hours. D-Dimer Recent Labs    03/07/19 0618 03/08/19 0618  DDIMER 0.75* 0.71*   Hgb A1c No results for input(s): HGBA1C in the last 72 hours. Lipid Profile No results for input(s): CHOL, HDL, LDLCALC, TRIG, CHOLHDL, LDLDIRECT in the last 72 hours. Thyroid function studies No results for input(s): TSH, T4TOTAL, T3FREE, THYROIDAB in the last 72 hours.  Invalid input(s): FREET3 Anemia work up Recent Labs    03/06/19 0400 03/07/19 0618  FERRITIN 412* 368*   Urinalysis No results found for: COLORURINE, APPEARANCEUR, LABSPEC, [place], GLUCOSEU, HGBUR, BILIRUBINUR, KETONESUR, PROTEINUR, UROBILINOGEN, NITRITE, LEUKOCYTESUR Sepsis Labs Invalid input(s): PROCALCITONIN,  WBC,  LACTICIDVEN Microbiology No results found for this or any previous visit (from the past 240 hour(s)).   Time coordinating discharge: 45 minutes  SIGNED:   Tawni Millers, MD  [facility] 03/08/2019, 8:03 AM

## 2019-03-08 NOTE — Progress Notes (Signed)
Ambulation Note  Saturation Pre: 98% on RA  Ambulation Distance: 280 ft  Saturation During Ambulation: 93-98% on RA  Notes: Pt used RW, gait belt, and assistance x1. Tolerated well. Pt returned to bed with call bell within reach, spO2 was 98% on RA.   Philis Kendall, MS, ACSM CEP 10:46 AM 03/08/2019

## 2019-03-12 DIAGNOSIS — M549 Dorsalgia, unspecified: Secondary | ICD-10-CM | POA: Diagnosis not present

## 2019-03-12 DIAGNOSIS — G8929 Other chronic pain: Secondary | ICD-10-CM | POA: Diagnosis not present

## 2019-03-12 DIAGNOSIS — J1282 Pneumonia due to coronavirus disease 2019: Secondary | ICD-10-CM | POA: Diagnosis not present

## 2019-03-12 DIAGNOSIS — E039 Hypothyroidism, unspecified: Secondary | ICD-10-CM | POA: Diagnosis not present

## 2019-03-12 DIAGNOSIS — F0390 Unspecified dementia without behavioral disturbance: Secondary | ICD-10-CM | POA: Diagnosis not present

## 2019-03-12 DIAGNOSIS — J45909 Unspecified asthma, uncomplicated: Secondary | ICD-10-CM | POA: Diagnosis not present

## 2019-03-12 DIAGNOSIS — J9601 Acute respiratory failure with hypoxia: Secondary | ICD-10-CM | POA: Diagnosis not present

## 2019-03-12 DIAGNOSIS — U071 COVID-19: Secondary | ICD-10-CM | POA: Diagnosis not present

## 2019-03-12 DIAGNOSIS — I119 Hypertensive heart disease without heart failure: Secondary | ICD-10-CM | POA: Diagnosis not present

## 2019-03-13 DIAGNOSIS — J1282 Pneumonia due to coronavirus disease 2019: Secondary | ICD-10-CM | POA: Diagnosis not present

## 2019-03-13 DIAGNOSIS — F0390 Unspecified dementia without behavioral disturbance: Secondary | ICD-10-CM | POA: Diagnosis not present

## 2019-03-13 DIAGNOSIS — J45909 Unspecified asthma, uncomplicated: Secondary | ICD-10-CM | POA: Diagnosis not present

## 2019-03-13 DIAGNOSIS — M549 Dorsalgia, unspecified: Secondary | ICD-10-CM | POA: Diagnosis not present

## 2019-03-13 DIAGNOSIS — I119 Hypertensive heart disease without heart failure: Secondary | ICD-10-CM | POA: Diagnosis not present

## 2019-03-13 DIAGNOSIS — E039 Hypothyroidism, unspecified: Secondary | ICD-10-CM | POA: Diagnosis not present

## 2019-03-13 DIAGNOSIS — U071 COVID-19: Secondary | ICD-10-CM | POA: Diagnosis not present

## 2019-03-13 DIAGNOSIS — J9601 Acute respiratory failure with hypoxia: Secondary | ICD-10-CM | POA: Diagnosis not present

## 2019-03-13 DIAGNOSIS — G8929 Other chronic pain: Secondary | ICD-10-CM | POA: Diagnosis not present

## 2019-03-14 DIAGNOSIS — E039 Hypothyroidism, unspecified: Secondary | ICD-10-CM | POA: Diagnosis not present

## 2019-03-14 DIAGNOSIS — J9601 Acute respiratory failure with hypoxia: Secondary | ICD-10-CM | POA: Diagnosis not present

## 2019-03-14 DIAGNOSIS — G8929 Other chronic pain: Secondary | ICD-10-CM | POA: Diagnosis not present

## 2019-03-14 DIAGNOSIS — F0390 Unspecified dementia without behavioral disturbance: Secondary | ICD-10-CM | POA: Diagnosis not present

## 2019-03-14 DIAGNOSIS — J1282 Pneumonia due to coronavirus disease 2019: Secondary | ICD-10-CM | POA: Diagnosis not present

## 2019-03-14 DIAGNOSIS — I119 Hypertensive heart disease without heart failure: Secondary | ICD-10-CM | POA: Diagnosis not present

## 2019-03-14 DIAGNOSIS — M549 Dorsalgia, unspecified: Secondary | ICD-10-CM | POA: Diagnosis not present

## 2019-03-14 DIAGNOSIS — U071 COVID-19: Secondary | ICD-10-CM | POA: Diagnosis not present

## 2019-03-14 DIAGNOSIS — J45909 Unspecified asthma, uncomplicated: Secondary | ICD-10-CM | POA: Diagnosis not present

## 2019-03-15 DIAGNOSIS — M549 Dorsalgia, unspecified: Secondary | ICD-10-CM | POA: Diagnosis not present

## 2019-03-15 DIAGNOSIS — Z09 Encounter for follow-up examination after completed treatment for conditions other than malignant neoplasm: Secondary | ICD-10-CM | POA: Diagnosis not present

## 2019-03-15 DIAGNOSIS — U071 COVID-19: Secondary | ICD-10-CM | POA: Diagnosis not present

## 2019-03-16 DIAGNOSIS — J1282 Pneumonia due to coronavirus disease 2019: Secondary | ICD-10-CM | POA: Diagnosis not present

## 2019-03-16 DIAGNOSIS — F0390 Unspecified dementia without behavioral disturbance: Secondary | ICD-10-CM | POA: Diagnosis not present

## 2019-03-16 DIAGNOSIS — M549 Dorsalgia, unspecified: Secondary | ICD-10-CM | POA: Diagnosis not present

## 2019-03-16 DIAGNOSIS — G8929 Other chronic pain: Secondary | ICD-10-CM | POA: Diagnosis not present

## 2019-03-16 DIAGNOSIS — I119 Hypertensive heart disease without heart failure: Secondary | ICD-10-CM | POA: Diagnosis not present

## 2019-03-16 DIAGNOSIS — J9601 Acute respiratory failure with hypoxia: Secondary | ICD-10-CM | POA: Diagnosis not present

## 2019-03-16 DIAGNOSIS — J45909 Unspecified asthma, uncomplicated: Secondary | ICD-10-CM | POA: Diagnosis not present

## 2019-03-16 DIAGNOSIS — E039 Hypothyroidism, unspecified: Secondary | ICD-10-CM | POA: Diagnosis not present

## 2019-03-16 DIAGNOSIS — U071 COVID-19: Secondary | ICD-10-CM | POA: Diagnosis not present

## 2019-03-17 DIAGNOSIS — I119 Hypertensive heart disease without heart failure: Secondary | ICD-10-CM | POA: Diagnosis not present

## 2019-03-17 DIAGNOSIS — U071 COVID-19: Secondary | ICD-10-CM | POA: Diagnosis not present

## 2019-03-17 DIAGNOSIS — J45909 Unspecified asthma, uncomplicated: Secondary | ICD-10-CM | POA: Diagnosis not present

## 2019-03-17 DIAGNOSIS — J9601 Acute respiratory failure with hypoxia: Secondary | ICD-10-CM | POA: Diagnosis not present

## 2019-03-17 DIAGNOSIS — F0390 Unspecified dementia without behavioral disturbance: Secondary | ICD-10-CM | POA: Diagnosis not present

## 2019-03-17 DIAGNOSIS — J1282 Pneumonia due to coronavirus disease 2019: Secondary | ICD-10-CM | POA: Diagnosis not present

## 2019-03-17 DIAGNOSIS — G8929 Other chronic pain: Secondary | ICD-10-CM | POA: Diagnosis not present

## 2019-03-17 DIAGNOSIS — E039 Hypothyroidism, unspecified: Secondary | ICD-10-CM | POA: Diagnosis not present

## 2019-03-17 DIAGNOSIS — M549 Dorsalgia, unspecified: Secondary | ICD-10-CM | POA: Diagnosis not present

## 2019-03-18 DIAGNOSIS — I119 Hypertensive heart disease without heart failure: Secondary | ICD-10-CM | POA: Diagnosis not present

## 2019-03-18 DIAGNOSIS — U071 COVID-19: Secondary | ICD-10-CM | POA: Diagnosis not present

## 2019-03-18 DIAGNOSIS — E039 Hypothyroidism, unspecified: Secondary | ICD-10-CM | POA: Diagnosis not present

## 2019-03-18 DIAGNOSIS — G8929 Other chronic pain: Secondary | ICD-10-CM | POA: Diagnosis not present

## 2019-03-18 DIAGNOSIS — F0390 Unspecified dementia without behavioral disturbance: Secondary | ICD-10-CM | POA: Diagnosis not present

## 2019-03-18 DIAGNOSIS — J1282 Pneumonia due to coronavirus disease 2019: Secondary | ICD-10-CM | POA: Diagnosis not present

## 2019-03-18 DIAGNOSIS — J9601 Acute respiratory failure with hypoxia: Secondary | ICD-10-CM | POA: Diagnosis not present

## 2019-03-18 DIAGNOSIS — J45909 Unspecified asthma, uncomplicated: Secondary | ICD-10-CM | POA: Diagnosis not present

## 2019-03-18 DIAGNOSIS — M549 Dorsalgia, unspecified: Secondary | ICD-10-CM | POA: Diagnosis not present

## 2019-03-19 DIAGNOSIS — F0390 Unspecified dementia without behavioral disturbance: Secondary | ICD-10-CM | POA: Diagnosis not present

## 2019-03-19 DIAGNOSIS — U071 COVID-19: Secondary | ICD-10-CM | POA: Diagnosis not present

## 2019-03-19 DIAGNOSIS — E039 Hypothyroidism, unspecified: Secondary | ICD-10-CM | POA: Diagnosis not present

## 2019-03-19 DIAGNOSIS — M549 Dorsalgia, unspecified: Secondary | ICD-10-CM | POA: Diagnosis not present

## 2019-03-19 DIAGNOSIS — J45909 Unspecified asthma, uncomplicated: Secondary | ICD-10-CM | POA: Diagnosis not present

## 2019-03-19 DIAGNOSIS — J9601 Acute respiratory failure with hypoxia: Secondary | ICD-10-CM | POA: Diagnosis not present

## 2019-03-19 DIAGNOSIS — J1282 Pneumonia due to coronavirus disease 2019: Secondary | ICD-10-CM | POA: Diagnosis not present

## 2019-03-19 DIAGNOSIS — G8929 Other chronic pain: Secondary | ICD-10-CM | POA: Diagnosis not present

## 2019-03-19 DIAGNOSIS — I119 Hypertensive heart disease without heart failure: Secondary | ICD-10-CM | POA: Diagnosis not present

## 2019-03-20 DIAGNOSIS — E039 Hypothyroidism, unspecified: Secondary | ICD-10-CM | POA: Diagnosis not present

## 2019-03-20 DIAGNOSIS — G8929 Other chronic pain: Secondary | ICD-10-CM | POA: Diagnosis not present

## 2019-03-20 DIAGNOSIS — F0390 Unspecified dementia without behavioral disturbance: Secondary | ICD-10-CM | POA: Diagnosis not present

## 2019-03-20 DIAGNOSIS — I119 Hypertensive heart disease without heart failure: Secondary | ICD-10-CM | POA: Diagnosis not present

## 2019-03-20 DIAGNOSIS — J9601 Acute respiratory failure with hypoxia: Secondary | ICD-10-CM | POA: Diagnosis not present

## 2019-03-20 DIAGNOSIS — J1282 Pneumonia due to coronavirus disease 2019: Secondary | ICD-10-CM | POA: Diagnosis not present

## 2019-03-20 DIAGNOSIS — U071 COVID-19: Secondary | ICD-10-CM | POA: Diagnosis not present

## 2019-03-20 DIAGNOSIS — J45909 Unspecified asthma, uncomplicated: Secondary | ICD-10-CM | POA: Diagnosis not present

## 2019-03-20 DIAGNOSIS — M549 Dorsalgia, unspecified: Secondary | ICD-10-CM | POA: Diagnosis not present

## 2019-03-21 DIAGNOSIS — K219 Gastro-esophageal reflux disease without esophagitis: Secondary | ICD-10-CM | POA: Diagnosis not present

## 2019-03-21 DIAGNOSIS — J309 Allergic rhinitis, unspecified: Secondary | ICD-10-CM | POA: Diagnosis not present

## 2019-03-21 DIAGNOSIS — G8929 Other chronic pain: Secondary | ICD-10-CM | POA: Diagnosis not present

## 2019-03-21 DIAGNOSIS — I119 Hypertensive heart disease without heart failure: Secondary | ICD-10-CM | POA: Diagnosis not present

## 2019-03-21 DIAGNOSIS — J9601 Acute respiratory failure with hypoxia: Secondary | ICD-10-CM | POA: Diagnosis not present

## 2019-03-21 DIAGNOSIS — J1282 Pneumonia due to coronavirus disease 2019: Secondary | ICD-10-CM | POA: Diagnosis not present

## 2019-03-21 DIAGNOSIS — M549 Dorsalgia, unspecified: Secondary | ICD-10-CM | POA: Diagnosis not present

## 2019-03-21 DIAGNOSIS — K5909 Other constipation: Secondary | ICD-10-CM | POA: Diagnosis not present

## 2019-03-21 DIAGNOSIS — U071 COVID-19: Secondary | ICD-10-CM | POA: Diagnosis not present

## 2019-03-21 DIAGNOSIS — J45909 Unspecified asthma, uncomplicated: Secondary | ICD-10-CM | POA: Diagnosis not present

## 2019-03-21 DIAGNOSIS — I499 Cardiac arrhythmia, unspecified: Secondary | ICD-10-CM | POA: Diagnosis not present

## 2019-03-21 DIAGNOSIS — E039 Hypothyroidism, unspecified: Secondary | ICD-10-CM | POA: Diagnosis not present

## 2019-03-21 DIAGNOSIS — F0390 Unspecified dementia without behavioral disturbance: Secondary | ICD-10-CM | POA: Diagnosis not present

## 2019-03-22 DIAGNOSIS — U071 COVID-19: Secondary | ICD-10-CM | POA: Diagnosis not present

## 2019-03-22 DIAGNOSIS — J45909 Unspecified asthma, uncomplicated: Secondary | ICD-10-CM | POA: Diagnosis not present

## 2019-03-22 DIAGNOSIS — I119 Hypertensive heart disease without heart failure: Secondary | ICD-10-CM | POA: Diagnosis not present

## 2019-03-22 DIAGNOSIS — M549 Dorsalgia, unspecified: Secondary | ICD-10-CM | POA: Diagnosis not present

## 2019-03-22 DIAGNOSIS — G8929 Other chronic pain: Secondary | ICD-10-CM | POA: Diagnosis not present

## 2019-03-22 DIAGNOSIS — J1282 Pneumonia due to coronavirus disease 2019: Secondary | ICD-10-CM | POA: Diagnosis not present

## 2019-03-22 DIAGNOSIS — E039 Hypothyroidism, unspecified: Secondary | ICD-10-CM | POA: Diagnosis not present

## 2019-03-22 DIAGNOSIS — F0390 Unspecified dementia without behavioral disturbance: Secondary | ICD-10-CM | POA: Diagnosis not present

## 2019-03-22 DIAGNOSIS — J9601 Acute respiratory failure with hypoxia: Secondary | ICD-10-CM | POA: Diagnosis not present

## 2019-03-23 ENCOUNTER — Telehealth (INDEPENDENT_AMBULATORY_CARE_PROVIDER_SITE_OTHER): Payer: Medicare HMO | Admitting: Cardiology

## 2019-03-23 ENCOUNTER — Other Ambulatory Visit: Payer: Self-pay

## 2019-03-23 ENCOUNTER — Encounter: Payer: Self-pay | Admitting: Cardiology

## 2019-03-23 VITALS — BP 120/80 | Wt 272.0 lb

## 2019-03-23 DIAGNOSIS — Z87891 Personal history of nicotine dependence: Secondary | ICD-10-CM

## 2019-03-23 DIAGNOSIS — R0602 Shortness of breath: Secondary | ICD-10-CM

## 2019-03-23 DIAGNOSIS — E039 Hypothyroidism, unspecified: Secondary | ICD-10-CM | POA: Diagnosis not present

## 2019-03-23 DIAGNOSIS — M549 Dorsalgia, unspecified: Secondary | ICD-10-CM | POA: Diagnosis not present

## 2019-03-23 DIAGNOSIS — Z8616 Personal history of COVID-19: Secondary | ICD-10-CM

## 2019-03-23 DIAGNOSIS — I77819 Aortic ectasia, unspecified site: Secondary | ICD-10-CM

## 2019-03-23 DIAGNOSIS — I10 Essential (primary) hypertension: Secondary | ICD-10-CM | POA: Diagnosis not present

## 2019-03-23 DIAGNOSIS — Z7189 Other specified counseling: Secondary | ICD-10-CM

## 2019-03-23 DIAGNOSIS — R0789 Other chest pain: Secondary | ICD-10-CM

## 2019-03-23 DIAGNOSIS — R002 Palpitations: Secondary | ICD-10-CM

## 2019-03-23 DIAGNOSIS — J9601 Acute respiratory failure with hypoxia: Secondary | ICD-10-CM | POA: Diagnosis not present

## 2019-03-23 DIAGNOSIS — J45909 Unspecified asthma, uncomplicated: Secondary | ICD-10-CM | POA: Diagnosis not present

## 2019-03-23 DIAGNOSIS — J1282 Pneumonia due to coronavirus disease 2019: Secondary | ICD-10-CM | POA: Diagnosis not present

## 2019-03-23 DIAGNOSIS — I119 Hypertensive heart disease without heart failure: Secondary | ICD-10-CM | POA: Diagnosis not present

## 2019-03-23 DIAGNOSIS — F0390 Unspecified dementia without behavioral disturbance: Secondary | ICD-10-CM | POA: Diagnosis not present

## 2019-03-23 DIAGNOSIS — U071 COVID-19: Secondary | ICD-10-CM | POA: Diagnosis not present

## 2019-03-23 DIAGNOSIS — Z09 Encounter for follow-up examination after completed treatment for conditions other than malignant neoplasm: Secondary | ICD-10-CM

## 2019-03-23 DIAGNOSIS — G8929 Other chronic pain: Secondary | ICD-10-CM | POA: Diagnosis not present

## 2019-03-23 HISTORY — DX: Palpitations: R00.2

## 2019-03-23 NOTE — Progress Notes (Signed)
Virtual Visit via Telephone Note   This visit type was conducted due to national recommendations for restrictions regarding the COVID-19 Pandemic (e.g. social distancing) in an effort to limit this patient's exposure and mitigate transmission in our community.  Due to his co-morbid illnesses, this patient is at least at moderate risk for complications without adequate follow up.  This format is felt to be most appropriate for this patient at this time.  The patient did not have access to video technology/had technical difficulties with video requiring transitioning to audio format only (telephone).  All issues noted in this document were discussed and addressed.  No physical exam could be performed with this format.  Please refer to the patient's chart for his  consent to telehealth for [facility].   Date:  03/23/2019   ID:  Johnny Compton, DOB 1940/07/21, MRN [id number]  Patient Location: Home Provider Location: Office  PCP:  [name], MD  Cardiologist:  No primary care provider on file.  Electrophysiologist:  None   Evaluation Performed:  New Patient Evaluation  Chief Complaint: Palpitations  History of Present Illness:    Johnny Compton is a 79 y.o. male with past medical history of hypertension.  He had Covid pneumonia and has come home.  He mentions to me that he is feeling weak and tired.  He has some shortness of breath on exertion but he tells me it is getting better.  He occasionally complains of pinprick like sensation in his chest.  This can happen with changes in posture.  He does not occur with exertion.  No orthopnea or PND.  He sometimes feels palpitations.  He wonders whether he has atrial fibrillation and there is no documentation of this that I could locate.  At the time of my evaluation, the patient is alert awake oriented and in no distress.  He has issues with dementia according to records  The patient does have symptoms concerning for COVID-19 infection (fever,  chills, cough, or new shortness of breath).    Past Medical History:  Diagnosis Date  . Dementia ([place])   . Essential hypertension   . GERD (gastroesophageal reflux disease)    No past surgical history on file.   Current Meds  Medication Sig  . acetaminophen (TYLENOL) 325 MG tablet Take 2 tablets (650 mg total) by mouth every 6 (six) hours as needed for moderate pain (back pain).  Marland Kitchen amitriptyline (ELAVIL) 100 MG tablet Take 100 mg by mouth at bedtime.  . budesonide-formoterol (SYMBICORT) 160-4.5 MCG/ACT inhaler Inhale 2 puffs into the lungs daily.   . cetirizine (ZYRTEC) 10 MG tablet Take 10 mg by mouth daily.  Marland Kitchen donepezil (ARICEPT) 10 MG tablet Take 10 mg by mouth at bedtime.  . ergocalciferol (VITAMIN D2) 1.25 MG (50000 UT) capsule Take 50,000 Units by mouth every Wednesday.   . esomeprazole (NEXIUM) 40 MG capsule Take 40 mg by mouth daily before breakfast.   . fluticasone (FLONASE) 50 MCG/ACT nasal spray Place 1 spray into both nostrils 2 (two) times daily.   Marland Kitchen levothyroxine (SYNTHROID) 125 MCG tablet Take 125 mcg by mouth daily before breakfast.  . memantine (NAMENDA) 10 MG tablet Take 10 mg by mouth at bedtime.   . montelukast (SINGULAIR) 10 MG tablet Take 10 mg by mouth daily.   . nitroGLYCERIN (NITROSTAT) 0.4 MG SL tablet Place 0.4 mg under the tongue every 5 (five) minutes as needed for chest pain.  Marland Kitchen ondansetron (ZOFRAN-ODT) 4 MG disintegrating tablet Take 4  mg by mouth every 8 (eight) hours as needed for nausea or vomiting.  . polyethylene glycol (MIRALAX / GLYCOLAX) 17 g packet Take 17 g by mouth every evening.   Marland Kitchen Propylene Glycol (SYSTANE BALANCE) 0.6 % SOLN Place 1 drop into both eyes 3 (three) times daily as needed (dry/irritated eyes.).     Allergies:   Patient has no known allergies.   Social History   Tobacco Use  . Smoking status: Former Research scientist (life sciences)  . Smokeless tobacco: Never Used  Substance Use Topics  . Alcohol use: Not Currently  . Drug use: Never     Family  Hx: The patient's family history is not on file.  ROS:   Please see the history of present illness.    As mentioned above All other systems reviewed and are negative.   Prior CV studies:   The following studies were reviewed today:  I reviewed lab work and other findings  Labs/Other Tests and Data Reviewed:    EKG:  No ECG reviewed.  Recent Labs: 03/04/2019: B Natriuretic Peptide 267.1 03/07/2019: Hemoglobin 16.0; Platelets 240 03/08/2019: ALT 44; BUN 20; Creatinine, Ser 0.83; Potassium 4.5; Sodium 134   Recent Lipid Panel No results found for: CHOL, TRIG, HDL, CHOLHDL, LDLCALC, LDLDIRECT  Wt Readings from Last 3 Encounters:  03/23/19 272 lb (123.4 kg)  03/04/19 262 lb 5.6 oz (119 kg)     Objective:    Vital Signs:  BP 120/80   Wt 272 lb (123.4 kg)   BMI 34.92 kg/m    VITAL SIGNS:  reviewed  ASSESSMENT & PLAN:    1. Post Covid pneumonitis: Patient is recovering.  He has shortness of breath on exertion but this is getting better slowly. 2. Chest discomfort: Appears atypical for coronary etiology however if he has any significant symptoms he was advised to go to the nearest emergency room.  Currently told me that he was symptom-free and ambulating okay without any chest discomfort symptoms 3. Because of his shortness of breath on exertion we will do an echocardiogram to assess any cardiomyopathy or any such issues especially from his Covid infection and post infection status. 4. Patient occasionally has palpitations and we will do a 3-day ZIO monitoring. 5. He will be seen in follow-up appointment in a month or earlier if he has any concerns.  He knows to go to the nearest emergency room for any concerning symptoms.  COVID-19 Education: The signs and symptoms of COVID-19 were discussed with the patient and how to seek care for testing (follow up with PCP or arrange E-visit).  The importance of social distancing was discussed today.  Time:   Today, I have spent 25  minutes with the patient with telehealth technology discussing the above problems.     Medication Adjustments/Labs and Tests Ordered: Current medicines are reviewed at length with the patient today.  Concerns regarding medicines are outlined above.   Tests Ordered: No orders of the defined types were placed in this encounter.   Medication Changes: No orders of the defined types were placed in this encounter.   Follow Up:  Either In Person or Virtual in 1 month(s)  Signed, Jenean Lindau, MD  03/23/2019 11:37 AM    [place]

## 2019-03-23 NOTE — Patient Instructions (Signed)
Medication Instructions:  No medication changes *If you need a refill on your cardiac medications before your next appointment, please call your pharmacy*  Lab Work: None ordered If you have labs (blood work) drawn today and your tests are completely normal, you will receive your results only by: Marland Kitchen MyChart Message (if you have MyChart) OR . A paper copy in the mail If you have any lab test that is abnormal or we need to change your treatment, we will call you to review the results.  Testing/Procedures: Your physician has requested that you have an echocardiogram. Echocardiography is a painless test that uses sound waves to create images of your heart. It provides your doctor with information about the size and shape of your heart and how well your heart's chambers and valves are working. This procedure takes approximately one hour. There are no restrictions for this procedure.  Your physician has recommended that you wear a Zio monitor. Zio monitors are medical devices that record the heart's electrical activity. Doctors most often [place] these monitors to diagnose arrhythmias. Arrhythmias are problems with the speed or rhythm of the heartbeat. The monitor is a small, portable device. You can wear one while you do your normal daily activities. This is usually used to diagnose what is causing palpitations/syncope (passing out). You will need to wear the Zio for 3 days.   Follow-Up: At [facility], you and your health needs are our priority.  As part of our continuing mission to provide you with exceptional heart care, we have created designated Provider Care Teams.  These Care Teams include your primary Cardiologist (physician) and Advanced Practice Providers (APPs -  Physician Assistants and Nurse Practitioners) who all work together to provide you with the care you need, when you need it.  Your next appointment:   1 month(s)  The format for your next appointment:   In Person  Provider:     Jyl Heinz, MD  Other Instructions  Echocardiogram An echocardiogram is a procedure that uses painless sound waves (ultrasound) to produce an image of the heart. Images from an echocardiogram can provide important information about:  Signs of coronary artery disease (CAD).  Aneurysm detection. An aneurysm is a weak or damaged part of an artery wall that bulges out from the normal force of blood pumping through the body.  Heart size and shape. Changes in the size or shape of the heart can be associated with certain conditions, including heart failure, aneurysm, and CAD.  Heart muscle function.  Heart valve function.  Signs of a past heart attack.  Fluid buildup around the heart.  Thickening of the heart muscle.  A tumor or infectious growth around the heart valves. Tell a health care provider about:  Any allergies you have.  All medicines you are taking, including vitamins, herbs, eye drops, creams, and over-the-counter medicines.  Any blood disorders you have.  Any surgeries you have had.  Any medical conditions you have.  Whether you are pregnant or may be pregnant. What are the risks? Generally, this is a safe procedure. However, problems may occur, including:  Allergic reaction to dye (contrast) that may be used during the procedure. What happens before the procedure? No specific preparation is needed. You may eat and drink normally. What happens during the procedure?   An IV tube may be inserted into one of your veins.  You may receive contrast through this tube. A contrast is an injection that improves the quality of the pictures from your  heart.  A gel will be applied to your chest.  A wand-like tool (transducer) will be moved over your chest. The gel will help to transmit the sound waves from the transducer.  The sound waves will harmlessly bounce off of your heart to allow the heart images to be captured in real-time motion. The images will be  recorded on a computer. The procedure may vary among health care providers and hospitals. What happens after the procedure?  You may return to your normal, everyday life, including diet, activities, and medicines, unless your health care provider tells you not to do that. Summary  An echocardiogram is a procedure that uses painless sound waves (ultrasound) to produce an image of the heart.  Images from an echocardiogram can provide important information about the size and shape of your heart, heart muscle function, heart valve function, and fluid buildup around your heart.  You do not need to do anything to prepare before this procedure. You may eat and drink normally.  After the echocardiogram is completed, you may return to your normal, everyday life, unless your health care provider tells you not to do that. This information is not intended to replace advice given to you by your health care provider. Make sure you discuss any questions you have with your health care provider. Document Revised: 05/18/2018 Document Reviewed: 02/28/2016 Elsevier Patient Education  [place].

## 2019-03-23 NOTE — Addendum Note (Signed)
Addended by: Truddie Hidden on: 03/23/2019 01:11 PM   Modules accepted: Orders

## 2019-03-26 DIAGNOSIS — U071 COVID-19: Secondary | ICD-10-CM | POA: Diagnosis not present

## 2019-03-26 DIAGNOSIS — J1282 Pneumonia due to coronavirus disease 2019: Secondary | ICD-10-CM | POA: Diagnosis not present

## 2019-03-26 DIAGNOSIS — E039 Hypothyroidism, unspecified: Secondary | ICD-10-CM | POA: Diagnosis not present

## 2019-03-26 DIAGNOSIS — G8929 Other chronic pain: Secondary | ICD-10-CM | POA: Diagnosis not present

## 2019-03-26 DIAGNOSIS — I119 Hypertensive heart disease without heart failure: Secondary | ICD-10-CM | POA: Diagnosis not present

## 2019-03-26 DIAGNOSIS — F0390 Unspecified dementia without behavioral disturbance: Secondary | ICD-10-CM | POA: Diagnosis not present

## 2019-03-26 DIAGNOSIS — J45909 Unspecified asthma, uncomplicated: Secondary | ICD-10-CM | POA: Diagnosis not present

## 2019-03-26 DIAGNOSIS — M549 Dorsalgia, unspecified: Secondary | ICD-10-CM | POA: Diagnosis not present

## 2019-03-26 DIAGNOSIS — J9601 Acute respiratory failure with hypoxia: Secondary | ICD-10-CM | POA: Diagnosis not present

## 2019-03-28 DIAGNOSIS — E039 Hypothyroidism, unspecified: Secondary | ICD-10-CM | POA: Diagnosis not present

## 2019-03-28 DIAGNOSIS — U071 COVID-19: Secondary | ICD-10-CM | POA: Diagnosis not present

## 2019-03-28 DIAGNOSIS — F0390 Unspecified dementia without behavioral disturbance: Secondary | ICD-10-CM | POA: Diagnosis not present

## 2019-03-28 DIAGNOSIS — G8929 Other chronic pain: Secondary | ICD-10-CM | POA: Diagnosis not present

## 2019-03-28 DIAGNOSIS — M549 Dorsalgia, unspecified: Secondary | ICD-10-CM | POA: Diagnosis not present

## 2019-03-28 DIAGNOSIS — J45909 Unspecified asthma, uncomplicated: Secondary | ICD-10-CM | POA: Diagnosis not present

## 2019-03-28 DIAGNOSIS — J9601 Acute respiratory failure with hypoxia: Secondary | ICD-10-CM | POA: Diagnosis not present

## 2019-03-28 DIAGNOSIS — I119 Hypertensive heart disease without heart failure: Secondary | ICD-10-CM | POA: Diagnosis not present

## 2019-03-28 DIAGNOSIS — J1282 Pneumonia due to coronavirus disease 2019: Secondary | ICD-10-CM | POA: Diagnosis not present

## 2019-03-30 DIAGNOSIS — M549 Dorsalgia, unspecified: Secondary | ICD-10-CM | POA: Diagnosis not present

## 2019-03-30 DIAGNOSIS — J1282 Pneumonia due to coronavirus disease 2019: Secondary | ICD-10-CM | POA: Diagnosis not present

## 2019-03-30 DIAGNOSIS — G8929 Other chronic pain: Secondary | ICD-10-CM | POA: Diagnosis not present

## 2019-03-30 DIAGNOSIS — J45909 Unspecified asthma, uncomplicated: Secondary | ICD-10-CM | POA: Diagnosis not present

## 2019-03-30 DIAGNOSIS — J9601 Acute respiratory failure with hypoxia: Secondary | ICD-10-CM | POA: Diagnosis not present

## 2019-03-30 DIAGNOSIS — I119 Hypertensive heart disease without heart failure: Secondary | ICD-10-CM | POA: Diagnosis not present

## 2019-03-30 DIAGNOSIS — F0390 Unspecified dementia without behavioral disturbance: Secondary | ICD-10-CM | POA: Diagnosis not present

## 2019-03-30 DIAGNOSIS — E039 Hypothyroidism, unspecified: Secondary | ICD-10-CM | POA: Diagnosis not present

## 2019-03-30 DIAGNOSIS — U071 COVID-19: Secondary | ICD-10-CM | POA: Diagnosis not present

## 2019-04-03 ENCOUNTER — Ambulatory Visit (INDEPENDENT_AMBULATORY_CARE_PROVIDER_SITE_OTHER): Payer: Medicare HMO

## 2019-04-03 DIAGNOSIS — I10 Essential (primary) hypertension: Secondary | ICD-10-CM

## 2019-04-03 DIAGNOSIS — R002 Palpitations: Secondary | ICD-10-CM

## 2019-04-04 DIAGNOSIS — I119 Hypertensive heart disease without heart failure: Secondary | ICD-10-CM | POA: Diagnosis not present

## 2019-04-04 DIAGNOSIS — J9601 Acute respiratory failure with hypoxia: Secondary | ICD-10-CM | POA: Diagnosis not present

## 2019-04-04 DIAGNOSIS — J1282 Pneumonia due to coronavirus disease 2019: Secondary | ICD-10-CM | POA: Diagnosis not present

## 2019-04-04 DIAGNOSIS — G8929 Other chronic pain: Secondary | ICD-10-CM | POA: Diagnosis not present

## 2019-04-04 DIAGNOSIS — E039 Hypothyroidism, unspecified: Secondary | ICD-10-CM | POA: Diagnosis not present

## 2019-04-04 DIAGNOSIS — F0390 Unspecified dementia without behavioral disturbance: Secondary | ICD-10-CM | POA: Diagnosis not present

## 2019-04-04 DIAGNOSIS — U071 COVID-19: Secondary | ICD-10-CM | POA: Diagnosis not present

## 2019-04-04 DIAGNOSIS — J45909 Unspecified asthma, uncomplicated: Secondary | ICD-10-CM | POA: Diagnosis not present

## 2019-04-04 DIAGNOSIS — M549 Dorsalgia, unspecified: Secondary | ICD-10-CM | POA: Diagnosis not present

## 2019-04-05 DIAGNOSIS — K219 Gastro-esophageal reflux disease without esophagitis: Secondary | ICD-10-CM | POA: Diagnosis not present

## 2019-04-05 DIAGNOSIS — R600 Localized edema: Secondary | ICD-10-CM | POA: Diagnosis not present

## 2019-04-05 DIAGNOSIS — E559 Vitamin D deficiency, unspecified: Secondary | ICD-10-CM | POA: Diagnosis not present

## 2019-04-05 DIAGNOSIS — I499 Cardiac arrhythmia, unspecified: Secondary | ICD-10-CM | POA: Diagnosis not present

## 2019-04-05 DIAGNOSIS — R413 Other amnesia: Secondary | ICD-10-CM | POA: Diagnosis not present

## 2019-04-05 DIAGNOSIS — J45909 Unspecified asthma, uncomplicated: Secondary | ICD-10-CM | POA: Diagnosis not present

## 2019-04-09 ENCOUNTER — Other Ambulatory Visit: Payer: Self-pay

## 2019-04-09 DIAGNOSIS — Z20828 Contact with and (suspected) exposure to other viral communicable diseases: Secondary | ICD-10-CM | POA: Diagnosis not present

## 2019-04-11 DIAGNOSIS — G8929 Other chronic pain: Secondary | ICD-10-CM | POA: Diagnosis not present

## 2019-04-11 DIAGNOSIS — U071 COVID-19: Secondary | ICD-10-CM | POA: Diagnosis not present

## 2019-04-11 DIAGNOSIS — J45909 Unspecified asthma, uncomplicated: Secondary | ICD-10-CM | POA: Diagnosis not present

## 2019-04-11 DIAGNOSIS — E039 Hypothyroidism, unspecified: Secondary | ICD-10-CM | POA: Diagnosis not present

## 2019-04-11 DIAGNOSIS — M549 Dorsalgia, unspecified: Secondary | ICD-10-CM | POA: Diagnosis not present

## 2019-04-11 DIAGNOSIS — J1282 Pneumonia due to coronavirus disease 2019: Secondary | ICD-10-CM | POA: Diagnosis not present

## 2019-04-11 DIAGNOSIS — I119 Hypertensive heart disease without heart failure: Secondary | ICD-10-CM | POA: Diagnosis not present

## 2019-04-11 DIAGNOSIS — J9601 Acute respiratory failure with hypoxia: Secondary | ICD-10-CM | POA: Diagnosis not present

## 2019-04-11 DIAGNOSIS — F0390 Unspecified dementia without behavioral disturbance: Secondary | ICD-10-CM | POA: Diagnosis not present

## 2019-04-12 DIAGNOSIS — F0390 Unspecified dementia without behavioral disturbance: Secondary | ICD-10-CM | POA: Diagnosis not present

## 2019-04-12 DIAGNOSIS — E039 Hypothyroidism, unspecified: Secondary | ICD-10-CM | POA: Diagnosis not present

## 2019-04-12 DIAGNOSIS — J9601 Acute respiratory failure with hypoxia: Secondary | ICD-10-CM | POA: Diagnosis not present

## 2019-04-12 DIAGNOSIS — M549 Dorsalgia, unspecified: Secondary | ICD-10-CM | POA: Diagnosis not present

## 2019-04-12 DIAGNOSIS — I119 Hypertensive heart disease without heart failure: Secondary | ICD-10-CM | POA: Diagnosis not present

## 2019-04-12 DIAGNOSIS — U071 COVID-19: Secondary | ICD-10-CM | POA: Diagnosis not present

## 2019-04-12 DIAGNOSIS — J45909 Unspecified asthma, uncomplicated: Secondary | ICD-10-CM | POA: Diagnosis not present

## 2019-04-12 DIAGNOSIS — J1282 Pneumonia due to coronavirus disease 2019: Secondary | ICD-10-CM | POA: Diagnosis not present

## 2019-04-12 DIAGNOSIS — G8929 Other chronic pain: Secondary | ICD-10-CM | POA: Diagnosis not present

## 2019-04-13 DIAGNOSIS — Z87898 Personal history of other specified conditions: Secondary | ICD-10-CM | POA: Diagnosis not present

## 2019-04-17 DIAGNOSIS — G8929 Other chronic pain: Secondary | ICD-10-CM | POA: Diagnosis not present

## 2019-04-17 DIAGNOSIS — I119 Hypertensive heart disease without heart failure: Secondary | ICD-10-CM | POA: Diagnosis not present

## 2019-04-17 DIAGNOSIS — M549 Dorsalgia, unspecified: Secondary | ICD-10-CM | POA: Diagnosis not present

## 2019-04-17 DIAGNOSIS — F0390 Unspecified dementia without behavioral disturbance: Secondary | ICD-10-CM | POA: Diagnosis not present

## 2019-04-17 DIAGNOSIS — U071 COVID-19: Secondary | ICD-10-CM | POA: Diagnosis not present

## 2019-04-17 DIAGNOSIS — E039 Hypothyroidism, unspecified: Secondary | ICD-10-CM | POA: Diagnosis not present

## 2019-04-17 DIAGNOSIS — J1282 Pneumonia due to coronavirus disease 2019: Secondary | ICD-10-CM | POA: Diagnosis not present

## 2019-04-17 DIAGNOSIS — J45909 Unspecified asthma, uncomplicated: Secondary | ICD-10-CM | POA: Diagnosis not present

## 2019-04-17 DIAGNOSIS — J9601 Acute respiratory failure with hypoxia: Secondary | ICD-10-CM | POA: Diagnosis not present

## 2019-04-23 ENCOUNTER — Telehealth: Payer: Self-pay | Admitting: Cardiology

## 2019-04-23 NOTE — Telephone Encounter (Signed)
°  Pt's returning call, he said he received a call last Friday saying we received his heart monitor results and according to his doctor they sent Dr. Geraldo Pitter all his blood work result, echo and EKG done at [facility]. He is wondering if he still need that echo scheduled on 05/09/19  Please advise

## 2019-04-24 ENCOUNTER — Other Ambulatory Visit: Payer: Self-pay | Admitting: Emergency Medicine

## 2019-04-24 ENCOUNTER — Other Ambulatory Visit: Payer: Self-pay

## 2019-04-24 DIAGNOSIS — I10 Essential (primary) hypertension: Secondary | ICD-10-CM

## 2019-04-24 NOTE — Telephone Encounter (Signed)
Called and spoke with the pt regarding his need for labs. Pt states that he has had labs done recently. I acknowledged this information and will attempt to obtain the records. Pt is aware that I will call back on 3/16 if unable to obtain the labs. Pt is aware that he needs to continue with his echo as ordered. Pt and wife verbalized understanding.

## 2019-04-24 NOTE — Telephone Encounter (Signed)
Please see if we have these reports.  If we have echo report we certainly do not need another echo.  Please pull the other records for me if available

## 2019-04-24 NOTE — Progress Notes (Signed)
Results reviewed with pt as per Dr. Julien Nordmann note.  Pt verbalized understanding and had no additional questions. Pt will go to [place] office today for labs. Unable to locate recent labs pt states he had done.

## 2019-04-25 DIAGNOSIS — J1282 Pneumonia due to coronavirus disease 2019: Secondary | ICD-10-CM | POA: Diagnosis not present

## 2019-04-25 DIAGNOSIS — G8929 Other chronic pain: Secondary | ICD-10-CM | POA: Diagnosis not present

## 2019-04-25 DIAGNOSIS — M549 Dorsalgia, unspecified: Secondary | ICD-10-CM | POA: Diagnosis not present

## 2019-04-25 DIAGNOSIS — F0390 Unspecified dementia without behavioral disturbance: Secondary | ICD-10-CM | POA: Diagnosis not present

## 2019-04-25 DIAGNOSIS — J45909 Unspecified asthma, uncomplicated: Secondary | ICD-10-CM | POA: Diagnosis not present

## 2019-04-25 DIAGNOSIS — E039 Hypothyroidism, unspecified: Secondary | ICD-10-CM | POA: Diagnosis not present

## 2019-04-25 DIAGNOSIS — I119 Hypertensive heart disease without heart failure: Secondary | ICD-10-CM | POA: Diagnosis not present

## 2019-04-25 DIAGNOSIS — J9601 Acute respiratory failure with hypoxia: Secondary | ICD-10-CM | POA: Diagnosis not present

## 2019-04-25 DIAGNOSIS — U071 COVID-19: Secondary | ICD-10-CM | POA: Diagnosis not present

## 2019-04-25 LAB — BASIC METABOLIC PANEL
BUN/Creatinine Ratio: 18 (ref 10–24)
BUN: 14 mg/dL (ref 8–27)
CO2: 23 mmol/L (ref 20–29)
Calcium: 9 mg/dL (ref 8.6–10.2)
Chloride: 106 mmol/L (ref 96–106)
Creatinine, Ser: 0.78 mg/dL (ref 0.76–1.27)
GFR calc Af Amer: 99 mL/min/{1.73_m2} (ref >59)
GFR calc non Af Amer: 86 mL/min/{1.73_m2} (ref >59)
Glucose: 111 mg/dL — ABNORMAL HIGH (ref 65–99)
Potassium: 4.1 mmol/L (ref 3.5–5.2)
Sodium: 142 mmol/L (ref 134–144)

## 2019-05-04 NOTE — Telephone Encounter (Signed)
Patient states he is calling to follow up in regards to echocardiogram scheduled for 05/09/19. He states he assumes echocardiogram is not necessary because he recently had an echocardiogram with normal results. Please return call to advise.

## 2019-05-04 NOTE — Telephone Encounter (Signed)
Pt states that he does not feel that he needs an echo. I reassured him that he has not had one done that he had an EKG. Pt states to cancel the echo. Echo cancelled at pt's request. Pt states that he will callback if he changes his mind.

## 2019-05-09 ENCOUNTER — Other Ambulatory Visit: Payer: Medicare HMO

## 2019-05-10 ENCOUNTER — Ambulatory Visit (INDEPENDENT_AMBULATORY_CARE_PROVIDER_SITE_OTHER): Payer: Medicare HMO

## 2019-05-10 ENCOUNTER — Other Ambulatory Visit: Payer: Self-pay

## 2019-05-10 DIAGNOSIS — I10 Essential (primary) hypertension: Secondary | ICD-10-CM

## 2019-05-10 DIAGNOSIS — R002 Palpitations: Secondary | ICD-10-CM | POA: Diagnosis not present

## 2019-05-11 NOTE — Addendum Note (Signed)
Addended by: Truddie Hidden on: 05/11/2019 03:42 PM   Modules accepted: Orders

## 2019-05-13 DIAGNOSIS — E785 Hyperlipidemia, unspecified: Secondary | ICD-10-CM | POA: Diagnosis not present

## 2019-05-13 DIAGNOSIS — E039 Hypothyroidism, unspecified: Secondary | ICD-10-CM | POA: Diagnosis not present

## 2019-05-13 DIAGNOSIS — J449 Chronic obstructive pulmonary disease, unspecified: Secondary | ICD-10-CM | POA: Diagnosis not present

## 2019-05-13 DIAGNOSIS — E669 Obesity, unspecified: Secondary | ICD-10-CM | POA: Diagnosis not present

## 2019-05-13 DIAGNOSIS — I249 Acute ischemic heart disease, unspecified: Secondary | ICD-10-CM | POA: Diagnosis not present

## 2019-05-13 DIAGNOSIS — M1990 Unspecified osteoarthritis, unspecified site: Secondary | ICD-10-CM | POA: Diagnosis not present

## 2019-05-13 DIAGNOSIS — F0390 Unspecified dementia without behavioral disturbance: Secondary | ICD-10-CM | POA: Diagnosis not present

## 2019-05-13 DIAGNOSIS — R079 Chest pain, unspecified: Secondary | ICD-10-CM | POA: Diagnosis not present

## 2019-05-13 DIAGNOSIS — F329 Major depressive disorder, single episode, unspecified: Secondary | ICD-10-CM | POA: Diagnosis not present

## 2019-05-13 DIAGNOSIS — K219 Gastro-esophageal reflux disease without esophagitis: Secondary | ICD-10-CM | POA: Diagnosis not present

## 2019-05-14 ENCOUNTER — Encounter: Payer: Self-pay | Admitting: Cardiology

## 2019-05-14 ENCOUNTER — Telehealth: Payer: Self-pay | Admitting: Cardiology

## 2019-05-14 DIAGNOSIS — F0390 Unspecified dementia without behavioral disturbance: Secondary | ICD-10-CM | POA: Diagnosis not present

## 2019-05-14 DIAGNOSIS — R079 Chest pain, unspecified: Secondary | ICD-10-CM | POA: Diagnosis not present

## 2019-05-14 DIAGNOSIS — I472 Ventricular tachycardia: Secondary | ICD-10-CM

## 2019-05-14 DIAGNOSIS — I10 Essential (primary) hypertension: Secondary | ICD-10-CM | POA: Diagnosis not present

## 2019-05-14 DIAGNOSIS — E785 Hyperlipidemia, unspecified: Secondary | ICD-10-CM | POA: Diagnosis not present

## 2019-05-14 DIAGNOSIS — E669 Obesity, unspecified: Secondary | ICD-10-CM | POA: Diagnosis not present

## 2019-05-14 DIAGNOSIS — J449 Chronic obstructive pulmonary disease, unspecified: Secondary | ICD-10-CM | POA: Diagnosis not present

## 2019-05-14 DIAGNOSIS — I493 Ventricular premature depolarization: Secondary | ICD-10-CM

## 2019-05-14 NOTE — Telephone Encounter (Signed)
Contacted patient he states he was discharged today from the hospital- they did multiple tests on him, he had a CT done and they advised that he had an aneurysm- but he was unsure where and the exact numbers of how big. Patient states that he still has the chest pain and soreness, he was given an RX when he left the hospital to start Metoprolol Succinate 25 mg (to take 1/2 daily) and see if that helped. Patient also advised he had nitro on his list if needed.  Advised message was sent to MD and Nurse to see if upcoming appointment would be a good idea, and receiving records from [place] hospital. (release of information may be needed). Patient thankful for call, will go to ER if chest pains worsen, does not get better with medication options.

## 2019-05-14 NOTE — Telephone Encounter (Signed)
New Message  Patient was admitted to [facility] on 05/13/19 and discharged today. Patient was told he had an aneurysm that was measuring at 4.1, but does not know where. Patient reports that he is still having chest pain and soreness on his left side. Patient would like to know if he should go back to the hospital or what he should do. Please call and assist.

## 2019-05-15 NOTE — Telephone Encounter (Signed)
Give next appt with me!

## 2019-05-16 NOTE — Telephone Encounter (Signed)
Spoke with pt's daughter to explain the purpose of the visit is to see how Mr. Johnny Compton is doing and how to take the prescribed medicine. Daughter verbalized plan and will attend the appointment with the pt.

## 2019-05-16 NOTE — Telephone Encounter (Signed)
Follow up   Patient's daughter states that she is returning call. Please call.

## 2019-05-18 DIAGNOSIS — R079 Chest pain, unspecified: Secondary | ICD-10-CM | POA: Diagnosis not present

## 2019-05-18 DIAGNOSIS — I712 Thoracic aortic aneurysm, without rupture: Secondary | ICD-10-CM | POA: Diagnosis not present

## 2019-05-18 DIAGNOSIS — Z09 Encounter for follow-up examination after completed treatment for conditions other than malignant neoplasm: Secondary | ICD-10-CM | POA: Diagnosis not present

## 2019-05-23 ENCOUNTER — Other Ambulatory Visit: Payer: Self-pay

## 2019-05-25 ENCOUNTER — Ambulatory Visit: Payer: Medicare HMO | Admitting: Cardiology

## 2019-05-25 ENCOUNTER — Encounter: Payer: Self-pay | Admitting: Cardiology

## 2019-05-25 ENCOUNTER — Encounter (INDEPENDENT_AMBULATORY_CARE_PROVIDER_SITE_OTHER): Payer: Self-pay

## 2019-05-25 ENCOUNTER — Other Ambulatory Visit: Payer: Self-pay

## 2019-05-25 VITALS — BP 136/68 | HR 80 | Temp 97.6°F | Ht 74.0 in | Wt 263.6 lb

## 2019-05-25 DIAGNOSIS — I77810 Thoracic aortic ectasia: Secondary | ICD-10-CM

## 2019-05-25 DIAGNOSIS — I10 Essential (primary) hypertension: Secondary | ICD-10-CM

## 2019-05-25 DIAGNOSIS — R002 Palpitations: Secondary | ICD-10-CM

## 2019-05-25 DIAGNOSIS — I429 Cardiomyopathy, unspecified: Secondary | ICD-10-CM | POA: Insufficient documentation

## 2019-05-25 HISTORY — DX: Cardiomyopathy, unspecified: I42.9

## 2019-05-25 HISTORY — DX: Thoracic aortic ectasia: I77.810

## 2019-05-25 NOTE — Patient Instructions (Signed)
Medication Instructions:  No medication changes *If you need a refill on your cardiac medications before your next appointment, please call your pharmacy*   Lab Work: Your physician recommends that you have labs today in the office. You had a BMET, CBC, TSH, LFT's and lipids.  If you have labs (blood work) drawn today and your tests are completely normal, you will receive your results only by: Marland Kitchen MyChart Message (if you have MyChart) OR . A paper copy in the mail If you have any lab test that is abnormal or we need to change your treatment, we will call you to review the results.   Testing/Procedures: None ordered   Follow-Up: At [facility], you and your health needs are our priority.  As part of our continuing mission to provide you with exceptional heart care, we have created designated Provider Care Teams.  These Care Teams include your primary Cardiologist (physician) and Advanced Practice Providers (APPs -  Physician Assistants and Nurse Practitioners) who all work together to provide you with the care you need, when you need it.  We recommend signing up for the patient portal called "MyChart".  Sign up information is provided on this After Visit Summary.  MyChart is used to connect with patients for Virtual Visits (Telemedicine).  Patients are able to view lab/test results, encounter notes, upcoming appointments, etc.  Non-urgent messages can be sent to your provider as well.   To learn more about what you can do with MyChart, go to [email or web address].    Your next appointment:   4 month(s)  The format for your next appointment:   In Person  Provider:   Jyl Heinz, MD   Other Instructions NA

## 2019-05-25 NOTE — Progress Notes (Signed)
Cardiology Office Note:    Date:  05/25/2019   ID:  Johnny Compton, DOB 1940/05/15, MRN [id number]  PCP:  Johnny Johns, MD  Cardiologist:  Johnny Lindau, MD   Referring MD: Johnny Johns, MD    ASSESSMENT:    1. Essential hypertension   2. Palpitations   3. Ascending aorta dilatation (HCC)   4. Cardiomyopathy, unspecified type ([place])    PLAN:    In order of problems listed above:  1. Primary prevention stressed with the patient.  Importance of compliance with diet medication and exercise stressed and he vocalized understanding.  He is walking on a regular basis and his effort tolerance is getting better. 2. Cardiomyopathy: Unclear etiology.  Ejection fraction is mildly depressed.  His stress test was unremarkable.  I do not see any value in adding the ACE inhibitor at this time because his blood pressure is borderline and he is followed potentials will increase and therefore I will let him be on a beta-blocker at this time. 3. He is fasting today and will have complete blood work including lipids 4. Hypothyroidism on thyroid replacement: He requests TSH to be checked and we will do that also. 5. Patient will be seen in follow-up appointment in 4 months or earlier if the patient has any concerns 6. Ascending aortic dilatation: We will monitor this closely and repeat CT in a year.  Patient and daughter had multiple questions which were answered to their satisfaction.   Medication Adjustments/Labs and Tests Ordered: Current medicines are reviewed at length with the patient today.  Concerns regarding medicines are outlined above.  Orders Placed This Encounter  Procedures  . Basic metabolic panel  . CBC with Differential/Platelet  . Hepatic function panel  . TSH  . Lipid panel   No orders of the defined types were placed in this encounter.    No chief complaint on file.    History of Present Illness:    Johnny Compton is a 79 y.o. male.  Patient has past medical history  of essential hypertension, COVID-19 pneumonitis.  He denies any problems at this time and takes care of activities of daily living.  No chest pain orthopnea or PND.  Evaluation in the hospital revealed her ejection fraction to be mildly diminished and mildly dilated ascending aorta.  Again he is asymptomatic.  He walks on a regular basis without any symptoms.  His effort tolerance is increasing.  At the time of my evaluation, the patient is alert awake oriented and in no distress.  His daughter accompanies him for his this visit.  Past Medical History:  Diagnosis Date  . Acute hypoxemic respiratory failure due to severe acute respiratory syndrome coronavirus 2 (SARS-CoV-2) disease ([place]) 03/04/2019  . Back pain 03/08/2019  . Chest pain   . COPD (chronic obstructive pulmonary disease) ([place])   . Dementia ([place])   . Essential hypertension   . GERD (gastroesophageal reflux disease)   . Hyperlipemia   . Hypothyroid 03/08/2019  . Obesity (BMI 30-39.9)   . Palpitations 03/23/2019  . Pneumonia due to COVID-19 virus 03/08/2019    Past Surgical History:  Procedure Laterality Date  . NO PAST SURGERIES      Current Medications: Current Meds  Medication Sig  . acetaminophen (TYLENOL) 325 MG tablet Take 2 tablets (650 mg total) by mouth every 6 (six) hours as needed for moderate pain (back pain).  Marland Kitchen amitriptyline (ELAVIL) 100 MG tablet Take 100 mg by mouth at bedtime.  Marland Kitchen  budesonide-formoterol (SYMBICORT) 160-4.5 MCG/ACT inhaler Inhale 2 puffs into the lungs daily.   . cetirizine (ZYRTEC) 10 MG tablet Take 10 mg by mouth daily.  Marland Kitchen donepezil (ARICEPT) 10 MG tablet Take 10 mg by mouth at bedtime.  . ergocalciferol (VITAMIN D2) 1.25 MG (50000 UT) capsule Take 50,000 Units by mouth every Wednesday.   . esomeprazole (NEXIUM) 40 MG capsule Take 40 mg by mouth daily before breakfast.   . fluticasone (FLONASE) 50 MCG/ACT nasal spray Place 1 spray into both nostrils 2 (two) times daily.   Marland Kitchen levothyroxine  (SYNTHROID) 125 MCG tablet Take 125 mcg by mouth daily before breakfast.  . memantine (NAMENDA) 10 MG tablet Take 10 mg by mouth at bedtime.   . metoprolol succinate (TOPROL-XL) 25 MG 24 hr tablet Take 12.5 mg by mouth daily.  . montelukast (SINGULAIR) 10 MG tablet Take 10 mg by mouth daily.   . nitroGLYCERIN (NITROSTAT) 0.4 MG SL tablet Place 0.4 mg under the tongue every 5 (five) minutes as needed for chest pain.  . polyethylene glycol (MIRALAX / GLYCOLAX) 17 g packet Take 17 g by mouth every evening.   Marland Kitchen Propylene Glycol (SYSTANE BALANCE) 0.6 % SOLN Place 1 drop into both eyes 3 (three) times daily as needed (dry/irritated eyes.).  Marland Kitchen tizanidine (ZANAFLEX) 2 MG capsule Take 2 mg by mouth 2 (two) times daily as needed for muscle spasms.  . traMADol (ULTRAM) 50 MG tablet Take 50 mg by mouth every 6 (six) hours as needed.     Allergies:   Patient has no known allergies.   Social History   Socioeconomic History  . Marital status: Married    Spouse name: Yolanda  . Number of children: 2  . Years of education: Not on file  . Highest education level: Not on file  Occupational History  . Occupation: retired  Tobacco Use  . Smoking status: Former Research scientist (life sciences)  . Smokeless tobacco: Never Used  Substance and Sexual Activity  . Alcohol use: Not Currently  . Drug use: Never  . Sexual activity: Not Currently  Other Topics Concern  . Not on file  Social History Narrative  . Not on file   Social Determinants of Health   Financial Resource Strain:   . Difficulty of Paying Living Expenses:   Food Insecurity:   . Worried About Charity fundraiser in the Last Year:   . Arboriculturist in the Last Year:   Transportation Needs:   . Film/video editor (Medical):   Marland Kitchen Lack of Transportation (Non-Medical):   Physical Activity:   . Days of Exercise per Week:   . Minutes of Exercise per Session:   Stress:   . Feeling of Stress :   Social Connections:   . Frequency of Communication with Friends  and Family:   . Frequency of Social Gatherings with Friends and Family:   . Attends Religious Services:   . Active Member of Clubs or Organizations:   . Attends Archivist Meetings:   Marland Kitchen Marital Status:      Family History: The patient's family history includes Cancer in his brother, brother, sister, and sister; Diabetes in his sister and sister.  ROS:   Please see the history of present illness.    All other systems reviewed and are negative.  EKGs/Labs/Other Studies Reviewed:    The following studies were reviewed today: IMPRESSIONS    1. Left ventricular ejection fraction, by estimation, is 45 to 50%. The  left ventricle  has mildly decreased function. The left ventricle  demonstrates global hypokinesis. There is mild concentric left ventricular  hypertrophy. Left ventricular diastolic  parameters are consistent with Grade I diastolic dysfunction (impaired  relaxation).  2. Right ventricular systolic function is normal. The right ventricular  size is normal. There is normal pulmonary artery systolic pressure.  3. The mitral valve is normal in structure. No evidence of mitral valve  regurgitation. No evidence of mitral stenosis.  4. The aortic valve is normal in structure. Aortic valve regurgitation is  not visualized. Mild aortic valve sclerosis is present, with no evidence  of aortic valve stenosis.  5. There is moderate to severe dilatation of the ascending aorta  measuring 44 mm.  6. The inferior vena cava is normal in size with greater than 50%  respiratory variability, suggesting right atrial pressure of 3 mmHg.    Recent Labs: 03/04/2019: B Natriuretic Peptide 267.1 03/07/2019: Hemoglobin 16.0; Platelets 240 03/08/2019: ALT 44 04/24/2019: BUN 14; Creatinine, Ser 0.78; Potassium 4.1; Sodium 142  Recent Lipid Panel No results found for: CHOL, TRIG, HDL, CHOLHDL, VLDL, LDLCALC, LDLDIRECT  Physical Exam:    VS:  BP 136/68   Pulse 80   Temp 97.6 F  (36.4 C)   Ht 6\' 2"  (1.88 m)   Wt 263 lb 9.6 oz (119.6 kg)   SpO2 96%   BMI 33.84 kg/m     Wt Readings from Last 3 Encounters:  05/25/19 263 lb 9.6 oz (119.6 kg)  03/23/19 272 lb (123.4 kg)  03/04/19 262 lb 5.6 oz (119 kg)     GEN: Patient is in no acute distress HEENT: Normal NECK: No JVD; No carotid bruits LYMPHATICS: No lymphadenopathy CARDIAC: Hear sounds regular, 2/6 systolic murmur at the apex. RESPIRATORY:  Clear to auscultation without rales, wheezing or rhonchi  ABDOMEN: Soft, non-tender, non-distended MUSCULOSKELETAL:  No edema; No deformity  SKIN: Warm and dry NEUROLOGIC:  Alert and oriented x 3 PSYCHIATRIC:  Normal affect   Signed, Johnny Lindau, MD  05/25/2019 9:28 AM    [place]

## 2019-05-26 LAB — CBC WITH DIFFERENTIAL/PLATELET
Basophils Absolute: 0.1 10*3/uL (ref 0.0–0.2)
Basos: 1 %
EOS (ABSOLUTE): 1.6 10*3/uL — ABNORMAL HIGH (ref 0.0–0.4)
Eos: 15 %
Hematocrit: 46.6 % (ref 37.5–51.0)
Hemoglobin: 16.3 g/dL (ref 13.0–17.7)
Immature Grans (Abs): 0 10*3/uL (ref 0.0–0.1)
Immature Granulocytes: 0 %
Lymphocytes Absolute: 3.7 10*3/uL — ABNORMAL HIGH (ref 0.7–3.1)
Lymphs: 35 %
MCH: 32.3 pg (ref 26.6–33.0)
MCHC: 35 g/dL (ref 31.5–35.7)
MCV: 92 fL (ref 79–97)
Monocytes Absolute: 1 10*3/uL — ABNORMAL HIGH (ref 0.1–0.9)
Monocytes: 9 %
Neutrophils Absolute: 4.3 10*3/uL (ref 1.4–7.0)
Neutrophils: 40 %
Platelets: 264 10*3/uL (ref 150–450)
RBC: 5.05 x10E6/uL (ref 4.14–5.80)
RDW: 13.1 % (ref 11.6–15.4)
WBC: 10.6 10*3/uL (ref 3.4–10.8)

## 2019-05-26 LAB — BASIC METABOLIC PANEL
BUN/Creatinine Ratio: 20 (ref 10–24)
BUN: 16 mg/dL (ref 8–27)
CO2: 23 mmol/L (ref 20–29)
Calcium: 9.4 mg/dL (ref 8.6–10.2)
Chloride: 103 mmol/L (ref 96–106)
Creatinine, Ser: 0.82 mg/dL (ref 0.76–1.27)
GFR calc Af Amer: 97 mL/min/{1.73_m2} (ref >59)
GFR calc non Af Amer: 84 mL/min/{1.73_m2} (ref >59)
Glucose: 87 mg/dL (ref 65–99)
Potassium: 4.2 mmol/L (ref 3.5–5.2)
Sodium: 140 mmol/L (ref 134–144)

## 2019-05-26 LAB — LIPID PANEL
Chol/HDL Ratio: 4.4 ratio (ref 0.0–5.0)
Cholesterol, Total: 153 mg/dL (ref 100–199)
HDL: 35 mg/dL — ABNORMAL LOW (ref >39)
LDL Chol Calc (NIH): 100 mg/dL — ABNORMAL HIGH (ref 0–99)
Triglycerides: 93 mg/dL (ref 0–149)
VLDL Cholesterol Cal: 18 mg/dL (ref 5–40)

## 2019-05-26 LAB — HEPATIC FUNCTION PANEL
ALT: 19 IU/L (ref 0–44)
AST: 21 IU/L (ref 0–40)
Albumin: 4.2 g/dL (ref 3.7–4.7)
Alkaline Phosphatase: 76 IU/L (ref 39–117)
Bilirubin Total: 0.4 mg/dL (ref 0.0–1.2)
Bilirubin, Direct: 0.13 mg/dL (ref 0.00–0.40)
Total Protein: 6.7 g/dL (ref 6.0–8.5)

## 2019-05-26 LAB — TSH: TSH: 0.446 u[IU]/mL — ABNORMAL LOW (ref 0.450–4.500)

## 2019-07-04 DIAGNOSIS — J309 Allergic rhinitis, unspecified: Secondary | ICD-10-CM | POA: Diagnosis not present

## 2019-07-04 DIAGNOSIS — R413 Other amnesia: Secondary | ICD-10-CM | POA: Diagnosis not present

## 2019-07-04 DIAGNOSIS — E039 Hypothyroidism, unspecified: Secondary | ICD-10-CM | POA: Diagnosis not present

## 2019-07-04 DIAGNOSIS — E559 Vitamin D deficiency, unspecified: Secondary | ICD-10-CM | POA: Diagnosis not present

## 2019-07-04 DIAGNOSIS — J45909 Unspecified asthma, uncomplicated: Secondary | ICD-10-CM | POA: Diagnosis not present

## 2019-07-04 DIAGNOSIS — R600 Localized edema: Secondary | ICD-10-CM | POA: Diagnosis not present

## 2019-07-04 DIAGNOSIS — K219 Gastro-esophageal reflux disease without esophagitis: Secondary | ICD-10-CM | POA: Diagnosis not present

## 2019-08-06 DIAGNOSIS — K219 Gastro-esophageal reflux disease without esophagitis: Secondary | ICD-10-CM | POA: Diagnosis not present

## 2019-08-06 DIAGNOSIS — Z8679 Personal history of other diseases of the circulatory system: Secondary | ICD-10-CM | POA: Diagnosis not present

## 2019-08-06 DIAGNOSIS — M1710 Unilateral primary osteoarthritis, unspecified knee: Secondary | ICD-10-CM | POA: Diagnosis not present

## 2019-08-09 DIAGNOSIS — M1712 Unilateral primary osteoarthritis, left knee: Secondary | ICD-10-CM | POA: Diagnosis not present

## 2019-08-10 ENCOUNTER — Telehealth: Payer: Self-pay | Admitting: Cardiology

## 2019-08-10 NOTE — Telephone Encounter (Signed)
Spoke to patient just now and got him scheduled to see Dr. Harriet Masson on 08/17/19. He verbalizes understanding and thanks me for the call back.    Encouraged patient to call back with any questions or concerns.

## 2019-08-10 NOTE — Telephone Encounter (Signed)
Patient calling requesting a sooner appointment than 09/19/2019. I did not see anything sooner with Dr. Geraldo Pitter, but he states he was told he could see Dr. Harriet Masson sooner.

## 2019-08-10 NOTE — Telephone Encounter (Signed)
yes

## 2019-08-17 ENCOUNTER — Ambulatory Visit: Payer: Medicare HMO | Admitting: Cardiology

## 2019-08-29 DIAGNOSIS — M1712 Unilateral primary osteoarthritis, left knee: Secondary | ICD-10-CM | POA: Diagnosis not present

## 2019-09-19 ENCOUNTER — Ambulatory Visit: Payer: Medicare HMO | Admitting: Cardiology

## 2019-11-02 ENCOUNTER — Telehealth: Payer: Self-pay

## 2019-11-02 NOTE — Telephone Encounter (Signed)
   [facility] Pre-operative Risk Assessment    HEARTCARE STAFF: - Please ensure there is not already an duplicate clearance open for this procedure. - Under Visit Info/Reason for Call, type in Other and utilize the format Clearance MM/DD/YY or Clearance TBD. Do not use dashes or single digits. - If request is for dental extraction, please clarify the # of teeth to be extracted.  Request for surgical clearance:  1. What type of surgery is being performed? Left total knee arthroplasty   2. When is this surgery scheduled? TBD   3. What type of clearance is required (medical clearance vs. Pharmacy clearance to hold med vs. Both)? Medical  4. Are there any medications that need to be held prior to surgery and how long?   5. Practice name and name of physician performing surgery? [facility]- Dr. Joya Salm   6. What is the office phone number? [phone]   7.   What is the office fax number? [phone]  8.   Anesthesia type (None, local, MAC, general) ? General   Lowella Grip 11/02/2019, 2:06 PM  _________________________________________________________________   (provider comments below)

## 2019-11-02 NOTE — Telephone Encounter (Signed)
   Primary Cardiologist: No primary care provider on file.  Chart reviewed as part of pre-operative protocol coverage. Given past medical history and time since last visit, based on ACC/AHA guidelines, Johnny Compton would be at acceptable risk for the planned procedure without further cardiovascular testing.   I will route this recommendation to the requesting party via Epic fax function and remove from pre-op pool.  Please call with questions.  Phill Myron. [place] Pugh, ANP, AACC  11/02/2019, 2:20 PM

## 2019-11-06 DIAGNOSIS — Z79899 Other long term (current) drug therapy: Secondary | ICD-10-CM | POA: Diagnosis not present

## 2019-11-06 DIAGNOSIS — M1710 Unilateral primary osteoarthritis, unspecified knee: Secondary | ICD-10-CM | POA: Diagnosis not present

## 2019-11-06 DIAGNOSIS — R413 Other amnesia: Secondary | ICD-10-CM | POA: Diagnosis not present

## 2019-11-06 DIAGNOSIS — K219 Gastro-esophageal reflux disease without esophagitis: Secondary | ICD-10-CM | POA: Diagnosis not present

## 2019-11-06 DIAGNOSIS — E559 Vitamin D deficiency, unspecified: Secondary | ICD-10-CM | POA: Diagnosis not present

## 2019-11-06 DIAGNOSIS — Z23 Encounter for immunization: Secondary | ICD-10-CM | POA: Diagnosis not present

## 2019-11-06 DIAGNOSIS — E039 Hypothyroidism, unspecified: Secondary | ICD-10-CM | POA: Diagnosis not present

## 2019-11-06 DIAGNOSIS — Z8679 Personal history of other diseases of the circulatory system: Secondary | ICD-10-CM | POA: Diagnosis not present

## 2019-11-06 DIAGNOSIS — E785 Hyperlipidemia, unspecified: Secondary | ICD-10-CM | POA: Diagnosis not present

## 2019-11-06 DIAGNOSIS — F320 Major depressive disorder, single episode, mild: Secondary | ICD-10-CM | POA: Diagnosis not present

## 2019-11-12 ENCOUNTER — Other Ambulatory Visit: Payer: Self-pay

## 2019-11-12 DIAGNOSIS — J309 Allergic rhinitis, unspecified: Secondary | ICD-10-CM | POA: Insufficient documentation

## 2019-11-12 DIAGNOSIS — R413 Other amnesia: Secondary | ICD-10-CM | POA: Insufficient documentation

## 2019-11-12 DIAGNOSIS — I700 Atherosclerosis of aorta: Secondary | ICD-10-CM | POA: Insufficient documentation

## 2019-11-12 DIAGNOSIS — R079 Chest pain, unspecified: Secondary | ICD-10-CM | POA: Insufficient documentation

## 2019-11-12 DIAGNOSIS — F32A Depression, unspecified: Secondary | ICD-10-CM | POA: Insufficient documentation

## 2019-11-12 DIAGNOSIS — I712 Thoracic aortic aneurysm, without rupture, unspecified: Secondary | ICD-10-CM | POA: Insufficient documentation

## 2019-11-12 DIAGNOSIS — R600 Localized edema: Secondary | ICD-10-CM | POA: Insufficient documentation

## 2019-11-12 DIAGNOSIS — R5383 Other fatigue: Secondary | ICD-10-CM | POA: Insufficient documentation

## 2019-11-12 DIAGNOSIS — K5909 Other constipation: Secondary | ICD-10-CM | POA: Insufficient documentation

## 2019-11-12 DIAGNOSIS — J45909 Unspecified asthma, uncomplicated: Secondary | ICD-10-CM | POA: Insufficient documentation

## 2019-11-12 DIAGNOSIS — G4700 Insomnia, unspecified: Secondary | ICD-10-CM | POA: Insufficient documentation

## 2019-11-12 DIAGNOSIS — Z833 Family history of diabetes mellitus: Secondary | ICD-10-CM | POA: Insufficient documentation

## 2019-11-12 DIAGNOSIS — M25561 Pain in right knee: Secondary | ICD-10-CM | POA: Insufficient documentation

## 2019-11-12 DIAGNOSIS — Z6833 Body mass index (BMI) 33.0-33.9, adult: Secondary | ICD-10-CM | POA: Insufficient documentation

## 2019-11-12 DIAGNOSIS — E559 Vitamin D deficiency, unspecified: Secondary | ICD-10-CM | POA: Insufficient documentation

## 2019-11-12 DIAGNOSIS — E669 Obesity, unspecified: Secondary | ICD-10-CM | POA: Insufficient documentation

## 2019-11-12 DIAGNOSIS — M25562 Pain in left knee: Secondary | ICD-10-CM | POA: Insufficient documentation

## 2019-11-12 DIAGNOSIS — E785 Hyperlipidemia, unspecified: Secondary | ICD-10-CM | POA: Insufficient documentation

## 2019-11-12 DIAGNOSIS — J449 Chronic obstructive pulmonary disease, unspecified: Secondary | ICD-10-CM | POA: Insufficient documentation

## 2019-11-13 ENCOUNTER — Ambulatory Visit (INDEPENDENT_AMBULATORY_CARE_PROVIDER_SITE_OTHER): Payer: Medicare HMO | Admitting: Cardiology

## 2019-11-13 ENCOUNTER — Other Ambulatory Visit: Payer: Self-pay

## 2019-11-13 ENCOUNTER — Encounter: Payer: Self-pay | Admitting: Cardiology

## 2019-11-13 VITALS — BP 122/76 | HR 93 | Ht 74.0 in | Wt 266.0 lb

## 2019-11-13 DIAGNOSIS — I77810 Thoracic aortic ectasia: Secondary | ICD-10-CM

## 2019-11-13 DIAGNOSIS — I700 Atherosclerosis of aorta: Secondary | ICD-10-CM | POA: Diagnosis not present

## 2019-11-13 DIAGNOSIS — I429 Cardiomyopathy, unspecified: Secondary | ICD-10-CM | POA: Diagnosis not present

## 2019-11-13 DIAGNOSIS — I10 Essential (primary) hypertension: Secondary | ICD-10-CM | POA: Diagnosis not present

## 2019-11-13 DIAGNOSIS — Z01810 Encounter for preprocedural cardiovascular examination: Secondary | ICD-10-CM

## 2019-11-13 DIAGNOSIS — E785 Hyperlipidemia, unspecified: Secondary | ICD-10-CM

## 2019-11-13 HISTORY — DX: Encounter for preprocedural cardiovascular examination: Z01.810

## 2019-11-13 MED ORDER — ATORVASTATIN CALCIUM 10 MG PO TABS: 10.0000 mg | ORAL_TABLET | Freq: Every day | ORAL | 3 refills | Status: DC

## 2019-11-13 NOTE — Addendum Note (Signed)
Addended by: Truddie Hidden on: 11/13/2019 09:30 AM   Modules accepted: Orders

## 2019-11-13 NOTE — Progress Notes (Addendum)
Cardiology Office Note:    Date:  11/13/2019   ID:  ETHIN DRUMMOND, DOB 18-Apr-1940, MRN [id number]  PCP:  [name], MD  Cardiologist:  Jenean Lindau, MD   Referring MD: [name], MD    ASSESSMENT:    1. Ascending aorta dilatation (HCC)   2. Essential hypertension   3. Cardiomyopathy, unspecified type ([place])   4. Thoracic aorta atherosclerosis ([place])   5. Dyslipidemia   6. Preoperative cardiovascular examination    PLAN:    In order of problems listed above:  1. Primary prevention stressed with the patient.  Importance of compliance with diet medication stressed any vocalized understanding. 2. Preoperative cardiovascular evaluation: I discussed this with the patient at extensive length.  In view of history of cardiomyopathy we will do echocardiogram to assess this.  Also a Lexiscan sestamibi will be done as patient has multiple risk factors for coronary artery disease.  If this is negative and is not at high risk for coronary events during the aforementioned surgery.  Meticulous hemodynamic monitoring and continued uninterrupted beta-blockade will further reduce risk of coronary events. 3. Ascending aortic dilatation: This was evaluated in April and the ascending aorta was 4. Essential hypertension: Blood pressure is stable 5. Mixed dyslipidemia: I will initiate the patient on 10 mg of atorvastatin 10.  He will have liver lipid check in 6 weeks.  His CT scan reveals thoracic atherosclerosis and this will help this condition.  I reviewed lab work from the [facility] available to me today. 6. Patient will be seen in follow-up appointment in 6 months or earlier if the patient has any concerns  Addendum: I did  research on the records from [facility].  Record review has revealed that patient had a stress test in the month of April which did not reveal any evidence of ischemia.  Also also echocardiogram was done and that did not reveal any significant changes.  Since then the  patient has had no change in symptom profile and leads a sedentary lifestyle without any issues.  In view of this I do not think the patient is a high risk for coronary events during the aforementioned surgery.  He remains at moderate risk because of age and cardiovascular abnormalities and ejection fraction issues.  I discussed this with him at length and he vocalized understanding.  Kindly follow recommendations made above.  Please do not hesitate to call us if any questions about his cardiovascular management.    Medication Adjustments/Labs and Tests Ordered: Current medicines are reviewed at length with the patient today.  Concerns regarding medicines are outlined above.  No orders of the defined types were placed in this encounter.  No orders of the defined types were placed in this encounter.    No chief complaint on file.    History of Present Illness:    Johnny Compton is a 79 y.o. male.  Patient has past medical history of cardiomyopathy, essential hypertension dyslipidemia and mildly dilated ascending aorta.  He is here for preop evaluation for knee surgery.  He denies any problems at this time and takes care of activities of daily living.  He leads a sedentary lifestyle.  No chest pain orthopnea or PND.  At the time of my evaluation, the patient is alert awake oriented and in no distress.  Because of his knee issues he is not very stable on his feet.  But he has not had a fall.  Past Medical History:  Diagnosis Date  .  Acute hypoxemic respiratory failure due to severe acute respiratory syndrome coronavirus 2 (SARS-CoV-2) disease ([place]) 03/04/2019  . Allergic rhinosinusitis   . Ascending aorta dilatation ([place]) 05/25/2019  . Back pain 03/08/2019  . BMI 33.0-33.9,adult   . Bronchial asthma   . Cardiomyopathy ([place]) 05/25/2019  . Chest pain   . Constipation, chronic   . COPD (chronic obstructive pulmonary disease) ([place])   . Dementia ([place])   . Dyslipidemia   . Essential hypertension    . Family history of diabetes mellitus   . Fatigue   . GERD (gastroesophageal reflux disease)   . Hyperlipemia   . Hypothyroid 03/08/2019  . Insomnia   . Knee pain, bilateral   . Mild depression ([place])   . Mild memory disturbance   . Obesity (BMI 30-39.9)   . Palpitations 03/23/2019  . Pedal edema   . Pneumonia due to COVID-19 virus 03/08/2019  . Thoracic aorta atherosclerosis ([place])   . Thoracic aortic aneurysm without rupture ([place])   . Vitamin D deficiency     Past Surgical History:  Procedure Laterality Date  . NO PAST SURGERIES      Current Medications: Current Meds  Medication Sig  . amitriptyline (ELAVIL) 100 MG tablet Take 100 mg by mouth at bedtime.  Marland Kitchen aspirin EC 81 MG tablet Take 81 mg by mouth daily. Swallow whole.  . cetirizine (ZYRTEC) 10 MG tablet Take 10 mg by mouth daily.  Marland Kitchen donepezil (ARICEPT) 10 MG tablet Take 10 mg by mouth at bedtime.  . ergocalciferol (VITAMIN D2) 1.25 MG (50000 UT) capsule Take 50,000 Units by mouth every Wednesday.   . esomeprazole (NEXIUM) 40 MG capsule Take 40 mg by mouth daily before breakfast.   . fluticasone (FLONASE) 50 MCG/ACT nasal spray Place 1 spray into both nostrils 2 (two) times daily.   Marland Kitchen levothyroxine (SYNTHROID) 150 MCG tablet   . memantine (NAMENDA) 10 MG tablet Take 10 mg by mouth at bedtime.   . montelukast (SINGULAIR) 10 MG tablet Take 10 mg by mouth daily.   . nitroGLYCERIN (NITROSTAT) 0.4 MG SL tablet Place 0.4 mg under the tongue every 5 (five) minutes as needed for chest pain.  Marland Kitchen omeprazole (PRILOSEC) 40 MG capsule Take 40 mg by mouth daily.  . polyethylene glycol (MIRALAX / GLYCOLAX) 17 g packet Take 17 g by mouth every evening.   Marland Kitchen Propylene Glycol (SYSTANE BALANCE) 0.6 % SOLN Place 1 drop into both eyes 3 (three) times daily as needed (dry/irritated eyes.).  . [DISCONTINUED] diclofenac Sodium (VOLTAREN) 1 % GEL Apply 1 application topically in the morning, at noon, in the evening, and at bedtime.     Allergies:    Patient has no known allergies.   Social History   Socioeconomic History  . Marital status: Married    Spouse name: Yolanda  . Number of children: 2  . Years of education: Not on file  . Highest education level: Not on file  Occupational History  . Occupation: retired  Tobacco Use  . Smoking status: Former Research scientist (life sciences)  . Smokeless tobacco: Never Used  Vaping Use  . Vaping Use: Never used  Substance and Sexual Activity  . Alcohol use: Not Currently  . Drug use: Never  . Sexual activity: Not Currently  Other Topics Concern  . Not on file  Social History Narrative  . Not on file   Social Determinants of Health   Financial Resource Strain:   . Difficulty of Paying Living Expenses: Not on file  Food Insecurity:   .  Worried About Charity fundraiser in the Last Year: Not on file  . Ran Out of Food in the Last Year: Not on file  Transportation Needs:   . Lack of Transportation (Medical): Not on file  . Lack of Transportation (Non-Medical): Not on file  Physical Activity:   . Days of Exercise per Week: Not on file  . Minutes of Exercise per Session: Not on file  Stress:   . Feeling of Stress : Not on file  Social Connections:   . Frequency of Communication with Friends and Family: Not on file  . Frequency of Social Gatherings with Friends and Family: Not on file  . Attends Religious Services: Not on file  . Active Member of Clubs or Organizations: Not on file  . Attends Archivist Meetings: Not on file  . Marital Status: Not on file     Family History: The patient's family history includes Cancer in his brother, brother, sister, and sister; Diabetes in his sister and sister.  ROS:   Please see the history of present illness.    All other systems reviewed and are negative.  EKGs/Labs/Other Studies Reviewed:    The following studies were reviewed today: EKG reveals sinus rhythm and PVCs and nonspecific ST-T changes.   Recent Labs: 03/04/2019: B Natriuretic  Peptide 267.1 05/25/2019: ALT 19; BUN 16; Creatinine, Ser 0.82; Hemoglobin 16.3; Platelets 264; Potassium 4.2; Sodium 140; TSH 0.446  Recent Lipid Panel    Component Value Date/Time   CHOL 153 05/25/2019 0924   TRIG 93 05/25/2019 0924   HDL 35 (L) 05/25/2019 0924   CHOLHDL 4.4 05/25/2019 0924   LDLCALC 100 (H) 05/25/2019 0924    Physical Exam:    VS:  BP 122/76   Pulse 93   Ht 6\' 2"  (1.88 m)   Wt 266 lb (120.7 kg)   SpO2 96%   BMI 34.15 kg/m     Wt Readings from Last 3 Encounters:  11/13/19 266 lb (120.7 kg)  05/25/19 263 lb 9.6 oz (119.6 kg)  03/23/19 272 lb (123.4 kg)     GEN: Patient is in no acute distress HEENT: Normal NECK: No JVD; No carotid bruits LYMPHATICS: No lymphadenopathy CARDIAC: Hear sounds regular, 2/6 systolic murmur at the apex. RESPIRATORY:  Clear to auscultation without rales, wheezing or rhonchi  ABDOMEN: Soft, non-tender, non-distended MUSCULOSKELETAL:  No edema; No deformity  SKIN: Warm and dry NEUROLOGIC:  Alert and oriented x 3 PSYCHIATRIC:  Normal affect   Signed, Jenean Lindau, MD  11/13/2019 9:19 AM    [place]

## 2019-11-13 NOTE — Patient Instructions (Signed)
Medication Instructions:  Your physician has recommended you make the following change in your medication:   Start Atorvastatin 10 mg daily.  *If you need a refill on your cardiac medications before your next appointment, please call your pharmacy*   Lab Work: Your physician recommends that you return for lab work in: 6 weeks (12/25/2019) You need to have labs done when you are fasting.  You can come Monday through Friday 8:30 am to 12:00 pm and 1:15 to 4:30. You do not need to make an appointment as the order has already been placed. We will be checking your LFT and Lipids.   If you have labs (blood work) drawn today and your tests are completely normal, you will receive your results only by: [place] [facility] MyChart Message (if you have MyChart) OR . A paper copy in the mail If you have any lab test that is abnormal or we need to change your treatment, we will call you to review the results.   Testing/Procedures: None ordered   Follow-Up: At [facility], you and your health needs are our priority.  As part of our continuing mission to provide you with exceptional heart care, we have created designated Provider Care Teams.  These Care Teams include your primary Cardiologist (physician) and Advanced Practice Providers (APPs -  Physician Assistants and Nurse Practitioners) who all work together to provide you with the care you need, when you need it.  We recommend signing up for the patient portal called "MyChart".  Sign up information is provided on this After Visit Summary.  MyChart is used to connect with patients for Virtual Visits (Telemedicine).  Patients are able to view lab/test results, encounter notes, upcoming appointments, etc.  Non-urgent messages can be sent to your provider as well.   To learn more about what you can do with MyChart, go to [email or web address].    Your next appointment:   4 month(s)  The format for your next appointment:   In Person  Provider:   Jyl Heinz, MD   Other Instructions Atorvastatin tablets What is this medicine? ATORVASTATIN (a TORE va sta tin) is known as a HMG-CoA reductase inhibitor or 'statin'. It lowers the level of cholesterol and triglycerides in the blood. This drug may also reduce the risk of heart attack, stroke, or other health problems in patients with risk factors for heart disease. Diet and lifestyle changes are often used with this drug. This medicine may be used for other purposes; ask your health care provider or pharmacist if you have questions. COMMON BRAND NAME(S): Lipitor What should I tell my health care provider before I take this medicine? They need to know if you have any of these conditions:  diabetes  if you often drink alcohol  history of stroke  kidney disease  liver disease  muscle aches or weakness  thyroid disease  an unusual or allergic reaction to atorvastatin, other medicines, foods, dyes, or preservatives  pregnant or trying to get pregnant  breast-feeding How should I use this medicine? Take this medicine by mouth with a glass of water. Follow the directions on the prescription label. You can take it with or without food. If it upsets your stomach, take it with food. Do not take with grapefruit juice. Take your medicine at regular intervals. Do not take it more often than directed. Do not stop taking except on your doctor's advice. Talk to your pediatrician regarding the use of this medicine in children. While this drug may be prescribed for  children as young as 72 for selected conditions, precautions do apply. Overdosage: If you think you have taken too much of this medicine contact a poison control center or emergency room at once. NOTE: This medicine is only for you. Do not share this medicine with others. What if I miss a dose? If you miss a dose, take it as soon as you can. If your next dose is to be taken in less than 12 hours, then do not take the missed dose. Take  the next dose at your regular time. Do not take double or extra doses. What may interact with this medicine? Do not take this medicine with any of the following medications:  dasabuvir; ombitasvir; paritaprevir; ritonavir  ombitasvir; paritaprevir; ritonavir  posaconazole  red yeast rice This medicine may also interact with the following medications:  alcohol  birth control pills  certain antibiotics like erythromycin and clarithromycin  certain antivirals for HIV or hepatitis  certain medicines for cholesterol like fenofibrate, gemfibrozil, and niacin  certain medicines for fungal infections like ketoconazole and itraconazole  colchicine  cyclosporine  digoxin  grapefruit juice  rifampin This list may not describe all possible interactions. Give your health care provider a list of all the medicines, herbs, non-prescription drugs, or dietary supplements you use. Also tell them if you smoke, drink alcohol, or use illegal drugs. Some items may interact with your medicine. What should I watch for while using this medicine? Visit your doctor or health care professional for regular check-ups. You may need regular tests to make sure your liver is working properly. Your health care professional may tell you to stop taking this medicine if you develop muscle problems. If your muscle problems do not go away after stopping this medicine, contact your health care professional. Do not become pregnant while taking this medicine. Women should inform their health care professional if they wish to become pregnant or think they might be pregnant. There is a potential for serious side effects to an unborn child. Talk to your health care professional or pharmacist for more information. Do not breast-feed an infant while taking this medicine. This medicine may increase blood sugar. Ask your healthcare provider if changes in diet or medicines are needed if you have diabetes. If you are going to  need surgery or other procedure, tell your doctor that you are using this medicine. This drug is only part of a total heart-health program. Your doctor or a dietician can suggest a low-cholesterol and low-fat diet to help. Avoid alcohol and smoking, and keep a proper exercise schedule. This medicine may cause a decrease in Co-Enzyme Q-10. You should make sure that you get enough Co-Enzyme Q-10 while you are taking this medicine. Discuss the foods you eat and the vitamins you take with your health care professional. What side effects may I notice from receiving this medicine? Side effects that you should report to your doctor or health care professional as soon as possible:  allergic reactions like skin rash, itching or hives, swelling of the face, lips, or tongue  fever  joint pain  loss of memory  redness, blistering, peeling or loosening of the skin, including inside the mouth  signs and symptoms of high blood sugar such as being more thirsty or hungry or having to urinate more than normal. You may also feel very tired or have blurry vision.  signs and symptoms of liver injury like dark yellow or brown urine; general ill feeling or flu-like symptoms; light-belly pain; unusually weak  or tired; yellowing of the eyes or skin  signs and symptoms of muscle injury like dark urine; trouble passing urine or change in the amount of urine; unusually weak or tired; muscle pain or side or back pain Side effects that usually do not require medical attention (report to your doctor or health care professional if they continue or are bothersome):  diarrhea  nausea  stomach pain  trouble sleeping  upset stomach This list may not describe all possible side effects. Call your doctor for medical advice about side effects. You may report side effects to FDA at 1-800-FDA-1088. Where should I keep my medicine? Keep out of the reach of children. Store between 20 and 25 degrees C (68 and 77 degrees F).  Throw away any unused medicine after the expiration date. NOTE: This sheet is a summary. It may not cover all possible information. If you have questions about this medicine, talk to your doctor, pharmacist, or health care provider.  2020 Elsevier/Gold Standard (2017-11-16 11:36:16)

## 2019-11-22 ENCOUNTER — Telehealth: Payer: Self-pay | Admitting: Cardiology

## 2019-11-22 NOTE — Telephone Encounter (Signed)
Ivin Booty with [facility] was calling to see if the could get the patients last visit notes. Please call [phone] Ext. [phone]

## 2019-11-23 NOTE — Telephone Encounter (Signed)
Records sent to [facility] via [facility].

## 2019-12-19 DIAGNOSIS — R52 Pain, unspecified: Secondary | ICD-10-CM | POA: Diagnosis not present

## 2019-12-19 DIAGNOSIS — E559 Vitamin D deficiency, unspecified: Secondary | ICD-10-CM | POA: Diagnosis not present

## 2019-12-19 DIAGNOSIS — J449 Chronic obstructive pulmonary disease, unspecified: Secondary | ICD-10-CM | POA: Diagnosis not present

## 2019-12-19 DIAGNOSIS — E785 Hyperlipidemia, unspecified: Secondary | ICD-10-CM | POA: Diagnosis not present

## 2019-12-19 DIAGNOSIS — Z87891 Personal history of nicotine dependence: Secondary | ICD-10-CM | POA: Diagnosis not present

## 2019-12-19 DIAGNOSIS — M79609 Pain in unspecified limb: Secondary | ICD-10-CM | POA: Diagnosis not present

## 2019-12-19 DIAGNOSIS — I10 Essential (primary) hypertension: Secondary | ICD-10-CM | POA: Diagnosis not present

## 2019-12-19 DIAGNOSIS — Z01818 Encounter for other preprocedural examination: Secondary | ICD-10-CM | POA: Diagnosis not present

## 2019-12-19 DIAGNOSIS — K219 Gastro-esophageal reflux disease without esophagitis: Secondary | ICD-10-CM | POA: Diagnosis not present

## 2020-01-09 DIAGNOSIS — Z1159 Encounter for screening for other viral diseases: Secondary | ICD-10-CM | POA: Diagnosis not present

## 2020-01-09 DIAGNOSIS — Z1152 Encounter for screening for COVID-19: Secondary | ICD-10-CM | POA: Diagnosis not present

## 2020-01-15 DIAGNOSIS — J449 Chronic obstructive pulmonary disease, unspecified: Secondary | ICD-10-CM | POA: Diagnosis not present

## 2020-01-15 DIAGNOSIS — G8918 Other acute postprocedural pain: Secondary | ICD-10-CM | POA: Diagnosis not present

## 2020-01-15 DIAGNOSIS — M1712 Unilateral primary osteoarthritis, left knee: Secondary | ICD-10-CM | POA: Diagnosis not present

## 2020-01-15 DIAGNOSIS — K599 Functional intestinal disorder, unspecified: Secondary | ICD-10-CM | POA: Diagnosis not present

## 2020-01-15 DIAGNOSIS — K219 Gastro-esophageal reflux disease without esophagitis: Secondary | ICD-10-CM | POA: Diagnosis not present

## 2020-01-15 DIAGNOSIS — Z471 Aftercare following joint replacement surgery: Secondary | ICD-10-CM | POA: Diagnosis not present

## 2020-01-15 DIAGNOSIS — I110 Hypertensive heart disease with heart failure: Secondary | ICD-10-CM | POA: Diagnosis not present

## 2020-01-15 DIAGNOSIS — E785 Hyperlipidemia, unspecified: Secondary | ICD-10-CM | POA: Diagnosis not present

## 2020-01-15 DIAGNOSIS — I10 Essential (primary) hypertension: Secondary | ICD-10-CM | POA: Diagnosis not present

## 2020-01-15 DIAGNOSIS — Z96652 Presence of left artificial knee joint: Secondary | ICD-10-CM | POA: Diagnosis not present

## 2020-01-15 DIAGNOSIS — Z9889 Other specified postprocedural states: Secondary | ICD-10-CM | POA: Diagnosis not present

## 2020-01-15 DIAGNOSIS — I509 Heart failure, unspecified: Secondary | ICD-10-CM | POA: Diagnosis not present

## 2020-01-15 DIAGNOSIS — J45909 Unspecified asthma, uncomplicated: Secondary | ICD-10-CM | POA: Diagnosis not present

## 2020-01-15 DIAGNOSIS — F0390 Unspecified dementia without behavioral disturbance: Secondary | ICD-10-CM | POA: Diagnosis not present

## 2020-01-16 ENCOUNTER — Telehealth: Payer: Self-pay | Admitting: Cardiology

## 2020-01-16 DIAGNOSIS — J45909 Unspecified asthma, uncomplicated: Secondary | ICD-10-CM | POA: Diagnosis not present

## 2020-01-16 DIAGNOSIS — E785 Hyperlipidemia, unspecified: Secondary | ICD-10-CM | POA: Diagnosis not present

## 2020-01-16 DIAGNOSIS — I110 Hypertensive heart disease with heart failure: Secondary | ICD-10-CM | POA: Diagnosis not present

## 2020-01-16 DIAGNOSIS — I509 Heart failure, unspecified: Secondary | ICD-10-CM | POA: Diagnosis not present

## 2020-01-16 DIAGNOSIS — K599 Functional intestinal disorder, unspecified: Secondary | ICD-10-CM | POA: Diagnosis not present

## 2020-01-16 DIAGNOSIS — J449 Chronic obstructive pulmonary disease, unspecified: Secondary | ICD-10-CM | POA: Diagnosis not present

## 2020-01-16 DIAGNOSIS — M1712 Unilateral primary osteoarthritis, left knee: Secondary | ICD-10-CM | POA: Diagnosis not present

## 2020-01-16 DIAGNOSIS — F0390 Unspecified dementia without behavioral disturbance: Secondary | ICD-10-CM | POA: Diagnosis not present

## 2020-01-16 DIAGNOSIS — K219 Gastro-esophageal reflux disease without esophagitis: Secondary | ICD-10-CM | POA: Diagnosis not present

## 2020-01-16 NOTE — Telephone Encounter (Signed)
Spoke with patients daughter [name] and relayed the pharmacists response as noted below. [name] was grateful for the follow up.

## 2020-01-16 NOTE — Telephone Encounter (Signed)
Pt c/o medication issue:  1. Name of Medication: Celebrex and Baby Aspirin  2. How are you currently taking this medication (dosage and times per day)? Pt took one baby aspirin yesterday. Daughter does not know dosage of Celebrex  3. Are you having a reaction (difficulty breathing--STAT)? no  4. What is your medication issue? Daughter wanted to know if patient is allowed to take these medications., The patient just had knee replacement surgery at [facility] and is not sure if he can take these medications. Please advise

## 2020-01-16 NOTE — Telephone Encounter (Signed)
Patient should take aspirin as this is probably being used to prevent a DVT (blood clot). I would not recommend celebrex if he has other medications that help with his pain. If he does take celebrex he should take it for the shortest period of time needed.

## 2020-01-17 DIAGNOSIS — K219 Gastro-esophageal reflux disease without esophagitis: Secondary | ICD-10-CM | POA: Diagnosis not present

## 2020-01-17 DIAGNOSIS — J45909 Unspecified asthma, uncomplicated: Secondary | ICD-10-CM | POA: Diagnosis not present

## 2020-01-17 DIAGNOSIS — Z471 Aftercare following joint replacement surgery: Secondary | ICD-10-CM | POA: Diagnosis not present

## 2020-01-17 DIAGNOSIS — U099 Post covid-19 condition, unspecified: Secondary | ICD-10-CM | POA: Diagnosis not present

## 2020-01-17 DIAGNOSIS — I4891 Unspecified atrial fibrillation: Secondary | ICD-10-CM | POA: Diagnosis not present

## 2020-01-17 DIAGNOSIS — I429 Cardiomyopathy, unspecified: Secondary | ICD-10-CM | POA: Diagnosis not present

## 2020-01-17 DIAGNOSIS — R5383 Other fatigue: Secondary | ICD-10-CM | POA: Diagnosis not present

## 2020-01-17 DIAGNOSIS — I499 Cardiac arrhythmia, unspecified: Secondary | ICD-10-CM | POA: Diagnosis not present

## 2020-01-17 DIAGNOSIS — M549 Dorsalgia, unspecified: Secondary | ICD-10-CM | POA: Diagnosis not present

## 2020-01-18 DIAGNOSIS — I429 Cardiomyopathy, unspecified: Secondary | ICD-10-CM | POA: Diagnosis not present

## 2020-01-18 DIAGNOSIS — M549 Dorsalgia, unspecified: Secondary | ICD-10-CM | POA: Diagnosis not present

## 2020-01-18 DIAGNOSIS — I4891 Unspecified atrial fibrillation: Secondary | ICD-10-CM | POA: Diagnosis not present

## 2020-01-18 DIAGNOSIS — U099 Post covid-19 condition, unspecified: Secondary | ICD-10-CM | POA: Diagnosis not present

## 2020-01-18 DIAGNOSIS — Z471 Aftercare following joint replacement surgery: Secondary | ICD-10-CM | POA: Diagnosis not present

## 2020-01-18 DIAGNOSIS — R5383 Other fatigue: Secondary | ICD-10-CM | POA: Diagnosis not present

## 2020-01-18 DIAGNOSIS — J45909 Unspecified asthma, uncomplicated: Secondary | ICD-10-CM | POA: Diagnosis not present

## 2020-01-18 DIAGNOSIS — I499 Cardiac arrhythmia, unspecified: Secondary | ICD-10-CM | POA: Diagnosis not present

## 2020-01-18 DIAGNOSIS — K219 Gastro-esophageal reflux disease without esophagitis: Secondary | ICD-10-CM | POA: Diagnosis not present

## 2020-01-19 DIAGNOSIS — K219 Gastro-esophageal reflux disease without esophagitis: Secondary | ICD-10-CM | POA: Diagnosis not present

## 2020-01-19 DIAGNOSIS — M549 Dorsalgia, unspecified: Secondary | ICD-10-CM | POA: Diagnosis not present

## 2020-01-19 DIAGNOSIS — Z471 Aftercare following joint replacement surgery: Secondary | ICD-10-CM | POA: Diagnosis not present

## 2020-01-19 DIAGNOSIS — R5383 Other fatigue: Secondary | ICD-10-CM | POA: Diagnosis not present

## 2020-01-19 DIAGNOSIS — I4891 Unspecified atrial fibrillation: Secondary | ICD-10-CM | POA: Diagnosis not present

## 2020-01-19 DIAGNOSIS — I429 Cardiomyopathy, unspecified: Secondary | ICD-10-CM | POA: Diagnosis not present

## 2020-01-19 DIAGNOSIS — J45909 Unspecified asthma, uncomplicated: Secondary | ICD-10-CM | POA: Diagnosis not present

## 2020-01-19 DIAGNOSIS — U099 Post covid-19 condition, unspecified: Secondary | ICD-10-CM | POA: Diagnosis not present

## 2020-01-19 DIAGNOSIS — I499 Cardiac arrhythmia, unspecified: Secondary | ICD-10-CM | POA: Diagnosis not present

## 2020-01-21 DIAGNOSIS — I499 Cardiac arrhythmia, unspecified: Secondary | ICD-10-CM | POA: Diagnosis not present

## 2020-01-21 DIAGNOSIS — M549 Dorsalgia, unspecified: Secondary | ICD-10-CM | POA: Diagnosis not present

## 2020-01-21 DIAGNOSIS — I429 Cardiomyopathy, unspecified: Secondary | ICD-10-CM | POA: Diagnosis not present

## 2020-01-21 DIAGNOSIS — I4891 Unspecified atrial fibrillation: Secondary | ICD-10-CM | POA: Diagnosis not present

## 2020-01-21 DIAGNOSIS — K219 Gastro-esophageal reflux disease without esophagitis: Secondary | ICD-10-CM | POA: Diagnosis not present

## 2020-01-21 DIAGNOSIS — Z471 Aftercare following joint replacement surgery: Secondary | ICD-10-CM | POA: Diagnosis not present

## 2020-01-21 DIAGNOSIS — J45909 Unspecified asthma, uncomplicated: Secondary | ICD-10-CM | POA: Diagnosis not present

## 2020-01-21 DIAGNOSIS — U099 Post covid-19 condition, unspecified: Secondary | ICD-10-CM | POA: Diagnosis not present

## 2020-01-21 DIAGNOSIS — R5383 Other fatigue: Secondary | ICD-10-CM | POA: Diagnosis not present

## 2020-01-22 DIAGNOSIS — Z471 Aftercare following joint replacement surgery: Secondary | ICD-10-CM | POA: Diagnosis not present

## 2020-01-22 DIAGNOSIS — I4891 Unspecified atrial fibrillation: Secondary | ICD-10-CM | POA: Diagnosis not present

## 2020-01-22 DIAGNOSIS — U099 Post covid-19 condition, unspecified: Secondary | ICD-10-CM | POA: Diagnosis not present

## 2020-01-22 DIAGNOSIS — J45909 Unspecified asthma, uncomplicated: Secondary | ICD-10-CM | POA: Diagnosis not present

## 2020-01-22 DIAGNOSIS — K219 Gastro-esophageal reflux disease without esophagitis: Secondary | ICD-10-CM | POA: Diagnosis not present

## 2020-01-22 DIAGNOSIS — I429 Cardiomyopathy, unspecified: Secondary | ICD-10-CM | POA: Diagnosis not present

## 2020-01-22 DIAGNOSIS — I499 Cardiac arrhythmia, unspecified: Secondary | ICD-10-CM | POA: Diagnosis not present

## 2020-01-22 DIAGNOSIS — R5383 Other fatigue: Secondary | ICD-10-CM | POA: Diagnosis not present

## 2020-01-22 DIAGNOSIS — M549 Dorsalgia, unspecified: Secondary | ICD-10-CM | POA: Diagnosis not present

## 2020-01-23 DIAGNOSIS — I499 Cardiac arrhythmia, unspecified: Secondary | ICD-10-CM | POA: Diagnosis not present

## 2020-01-23 DIAGNOSIS — K219 Gastro-esophageal reflux disease without esophagitis: Secondary | ICD-10-CM | POA: Diagnosis not present

## 2020-01-23 DIAGNOSIS — I4891 Unspecified atrial fibrillation: Secondary | ICD-10-CM | POA: Diagnosis not present

## 2020-01-23 DIAGNOSIS — Z471 Aftercare following joint replacement surgery: Secondary | ICD-10-CM | POA: Diagnosis not present

## 2020-01-23 DIAGNOSIS — I429 Cardiomyopathy, unspecified: Secondary | ICD-10-CM | POA: Diagnosis not present

## 2020-01-23 DIAGNOSIS — J45909 Unspecified asthma, uncomplicated: Secondary | ICD-10-CM | POA: Diagnosis not present

## 2020-01-23 DIAGNOSIS — R5383 Other fatigue: Secondary | ICD-10-CM | POA: Diagnosis not present

## 2020-01-23 DIAGNOSIS — U099 Post covid-19 condition, unspecified: Secondary | ICD-10-CM | POA: Diagnosis not present

## 2020-01-23 DIAGNOSIS — M549 Dorsalgia, unspecified: Secondary | ICD-10-CM | POA: Diagnosis not present

## 2020-01-24 DIAGNOSIS — Z471 Aftercare following joint replacement surgery: Secondary | ICD-10-CM | POA: Diagnosis not present

## 2020-01-24 DIAGNOSIS — I499 Cardiac arrhythmia, unspecified: Secondary | ICD-10-CM | POA: Diagnosis not present

## 2020-01-24 DIAGNOSIS — K219 Gastro-esophageal reflux disease without esophagitis: Secondary | ICD-10-CM | POA: Diagnosis not present

## 2020-01-24 DIAGNOSIS — M549 Dorsalgia, unspecified: Secondary | ICD-10-CM | POA: Diagnosis not present

## 2020-01-24 DIAGNOSIS — I429 Cardiomyopathy, unspecified: Secondary | ICD-10-CM | POA: Diagnosis not present

## 2020-01-24 DIAGNOSIS — R5383 Other fatigue: Secondary | ICD-10-CM | POA: Diagnosis not present

## 2020-01-24 DIAGNOSIS — J45909 Unspecified asthma, uncomplicated: Secondary | ICD-10-CM | POA: Diagnosis not present

## 2020-01-24 DIAGNOSIS — U099 Post covid-19 condition, unspecified: Secondary | ICD-10-CM | POA: Diagnosis not present

## 2020-01-24 DIAGNOSIS — I4891 Unspecified atrial fibrillation: Secondary | ICD-10-CM | POA: Diagnosis not present

## 2020-01-25 DIAGNOSIS — I429 Cardiomyopathy, unspecified: Secondary | ICD-10-CM | POA: Diagnosis not present

## 2020-01-25 DIAGNOSIS — I4891 Unspecified atrial fibrillation: Secondary | ICD-10-CM | POA: Diagnosis not present

## 2020-01-25 DIAGNOSIS — R5383 Other fatigue: Secondary | ICD-10-CM | POA: Diagnosis not present

## 2020-01-25 DIAGNOSIS — K219 Gastro-esophageal reflux disease without esophagitis: Secondary | ICD-10-CM | POA: Diagnosis not present

## 2020-01-25 DIAGNOSIS — J45909 Unspecified asthma, uncomplicated: Secondary | ICD-10-CM | POA: Diagnosis not present

## 2020-01-25 DIAGNOSIS — Z471 Aftercare following joint replacement surgery: Secondary | ICD-10-CM | POA: Diagnosis not present

## 2020-01-25 DIAGNOSIS — M549 Dorsalgia, unspecified: Secondary | ICD-10-CM | POA: Diagnosis not present

## 2020-01-25 DIAGNOSIS — U099 Post covid-19 condition, unspecified: Secondary | ICD-10-CM | POA: Diagnosis not present

## 2020-01-25 DIAGNOSIS — I499 Cardiac arrhythmia, unspecified: Secondary | ICD-10-CM | POA: Diagnosis not present

## 2020-01-29 DIAGNOSIS — R2689 Other abnormalities of gait and mobility: Secondary | ICD-10-CM | POA: Diagnosis not present

## 2020-01-29 DIAGNOSIS — M6281 Muscle weakness (generalized): Secondary | ICD-10-CM | POA: Diagnosis not present

## 2020-01-31 DIAGNOSIS — M6281 Muscle weakness (generalized): Secondary | ICD-10-CM | POA: Diagnosis not present

## 2020-01-31 DIAGNOSIS — R2689 Other abnormalities of gait and mobility: Secondary | ICD-10-CM | POA: Diagnosis not present

## 2020-02-05 DIAGNOSIS — R2689 Other abnormalities of gait and mobility: Secondary | ICD-10-CM | POA: Diagnosis not present

## 2020-02-05 DIAGNOSIS — M6281 Muscle weakness (generalized): Secondary | ICD-10-CM | POA: Diagnosis not present

## 2020-02-13 DIAGNOSIS — M6281 Muscle weakness (generalized): Secondary | ICD-10-CM | POA: Diagnosis not present

## 2020-02-13 DIAGNOSIS — R2689 Other abnormalities of gait and mobility: Secondary | ICD-10-CM | POA: Diagnosis not present

## 2020-02-14 DIAGNOSIS — Z741 Need for assistance with personal care: Secondary | ICD-10-CM | POA: Diagnosis not present

## 2020-02-19 DIAGNOSIS — R2689 Other abnormalities of gait and mobility: Secondary | ICD-10-CM | POA: Diagnosis not present

## 2020-02-19 DIAGNOSIS — M6281 Muscle weakness (generalized): Secondary | ICD-10-CM | POA: Diagnosis not present

## 2020-02-20 DIAGNOSIS — K219 Gastro-esophageal reflux disease without esophagitis: Secondary | ICD-10-CM | POA: Diagnosis not present

## 2020-02-20 DIAGNOSIS — G4700 Insomnia, unspecified: Secondary | ICD-10-CM | POA: Diagnosis not present

## 2020-02-20 DIAGNOSIS — E559 Vitamin D deficiency, unspecified: Secondary | ICD-10-CM | POA: Diagnosis not present

## 2020-02-20 DIAGNOSIS — F32A Depression, unspecified: Secondary | ICD-10-CM | POA: Diagnosis not present

## 2020-02-20 DIAGNOSIS — R413 Other amnesia: Secondary | ICD-10-CM | POA: Diagnosis not present

## 2020-02-20 DIAGNOSIS — Z8679 Personal history of other diseases of the circulatory system: Secondary | ICD-10-CM | POA: Diagnosis not present

## 2020-02-20 DIAGNOSIS — M1710 Unilateral primary osteoarthritis, unspecified knee: Secondary | ICD-10-CM | POA: Diagnosis not present

## 2020-02-20 DIAGNOSIS — E039 Hypothyroidism, unspecified: Secondary | ICD-10-CM | POA: Diagnosis not present

## 2020-02-20 DIAGNOSIS — E785 Hyperlipidemia, unspecified: Secondary | ICD-10-CM | POA: Diagnosis not present

## 2020-02-22 DIAGNOSIS — M6281 Muscle weakness (generalized): Secondary | ICD-10-CM | POA: Diagnosis not present

## 2020-02-22 DIAGNOSIS — R2689 Other abnormalities of gait and mobility: Secondary | ICD-10-CM | POA: Diagnosis not present

## 2020-02-26 DIAGNOSIS — R2689 Other abnormalities of gait and mobility: Secondary | ICD-10-CM | POA: Diagnosis not present

## 2020-02-26 DIAGNOSIS — M6281 Muscle weakness (generalized): Secondary | ICD-10-CM | POA: Diagnosis not present

## 2020-02-27 DIAGNOSIS — E039 Hypothyroidism, unspecified: Secondary | ICD-10-CM | POA: Diagnosis not present

## 2020-02-27 DIAGNOSIS — M25562 Pain in left knee: Secondary | ICD-10-CM | POA: Diagnosis not present

## 2020-02-27 DIAGNOSIS — Z96652 Presence of left artificial knee joint: Secondary | ICD-10-CM | POA: Diagnosis not present

## 2020-02-27 DIAGNOSIS — D72829 Elevated white blood cell count, unspecified: Secondary | ICD-10-CM | POA: Diagnosis not present

## 2020-03-05 ENCOUNTER — Telehealth: Payer: Self-pay

## 2020-03-05 DIAGNOSIS — E785 Hyperlipidemia, unspecified: Secondary | ICD-10-CM

## 2020-03-05 MED ORDER — ATORVASTATIN CALCIUM 10 MG PO TABS: 10.0000 mg | ORAL_TABLET | Freq: Every day | ORAL | 3 refills | Status: DC

## 2020-03-05 NOTE — Telephone Encounter (Signed)
Refill sent to pharmacy.   

## 2020-03-06 DIAGNOSIS — M6281 Muscle weakness (generalized): Secondary | ICD-10-CM | POA: Diagnosis not present

## 2020-03-06 DIAGNOSIS — R2689 Other abnormalities of gait and mobility: Secondary | ICD-10-CM | POA: Diagnosis not present

## 2020-03-12 ENCOUNTER — Other Ambulatory Visit: Payer: Self-pay

## 2020-03-12 DIAGNOSIS — M6281 Muscle weakness (generalized): Secondary | ICD-10-CM | POA: Diagnosis not present

## 2020-03-12 DIAGNOSIS — R2689 Other abnormalities of gait and mobility: Secondary | ICD-10-CM | POA: Diagnosis not present

## 2020-03-18 ENCOUNTER — Ambulatory Visit: Payer: Medicare HMO | Admitting: Cardiology

## 2020-03-19 DIAGNOSIS — R2689 Other abnormalities of gait and mobility: Secondary | ICD-10-CM | POA: Diagnosis not present

## 2020-03-19 DIAGNOSIS — M6281 Muscle weakness (generalized): Secondary | ICD-10-CM | POA: Diagnosis not present

## 2020-04-02 DIAGNOSIS — R2689 Other abnormalities of gait and mobility: Secondary | ICD-10-CM | POA: Diagnosis not present

## 2020-04-02 DIAGNOSIS — M6281 Muscle weakness (generalized): Secondary | ICD-10-CM | POA: Diagnosis not present

## 2020-05-12 DIAGNOSIS — M79672 Pain in left foot: Secondary | ICD-10-CM | POA: Diagnosis not present

## 2020-05-12 DIAGNOSIS — R5383 Other fatigue: Secondary | ICD-10-CM | POA: Diagnosis not present

## 2020-05-12 DIAGNOSIS — E559 Vitamin D deficiency, unspecified: Secondary | ICD-10-CM | POA: Diagnosis not present

## 2020-05-12 DIAGNOSIS — Z79899 Other long term (current) drug therapy: Secondary | ICD-10-CM | POA: Diagnosis not present

## 2020-05-12 DIAGNOSIS — R600 Localized edema: Secondary | ICD-10-CM | POA: Diagnosis not present

## 2020-05-12 DIAGNOSIS — M1710 Unilateral primary osteoarthritis, unspecified knee: Secondary | ICD-10-CM | POA: Diagnosis not present

## 2020-05-12 DIAGNOSIS — Z6833 Body mass index (BMI) 33.0-33.9, adult: Secondary | ICD-10-CM | POA: Diagnosis not present

## 2020-05-12 DIAGNOSIS — M159 Polyosteoarthritis, unspecified: Secondary | ICD-10-CM | POA: Diagnosis not present

## 2020-05-13 DIAGNOSIS — M79672 Pain in left foot: Secondary | ICD-10-CM | POA: Diagnosis not present

## 2020-05-13 DIAGNOSIS — Z79899 Other long term (current) drug therapy: Secondary | ICD-10-CM | POA: Diagnosis not present

## 2020-05-13 DIAGNOSIS — E559 Vitamin D deficiency, unspecified: Secondary | ICD-10-CM | POA: Diagnosis not present

## 2020-05-15 DIAGNOSIS — E785 Hyperlipidemia, unspecified: Secondary | ICD-10-CM | POA: Diagnosis not present

## 2020-05-15 DIAGNOSIS — R413 Other amnesia: Secondary | ICD-10-CM | POA: Diagnosis not present

## 2020-05-15 DIAGNOSIS — K219 Gastro-esophageal reflux disease without esophagitis: Secondary | ICD-10-CM | POA: Diagnosis not present

## 2020-05-15 DIAGNOSIS — E559 Vitamin D deficiency, unspecified: Secondary | ICD-10-CM | POA: Diagnosis not present

## 2020-05-15 DIAGNOSIS — M1710 Unilateral primary osteoarthritis, unspecified knee: Secondary | ICD-10-CM | POA: Diagnosis not present

## 2020-05-15 DIAGNOSIS — E039 Hypothyroidism, unspecified: Secondary | ICD-10-CM | POA: Diagnosis not present

## 2020-05-15 DIAGNOSIS — F32A Depression, unspecified: Secondary | ICD-10-CM | POA: Diagnosis not present

## 2020-05-15 DIAGNOSIS — G4700 Insomnia, unspecified: Secondary | ICD-10-CM | POA: Diagnosis not present

## 2020-05-15 DIAGNOSIS — Z8679 Personal history of other diseases of the circulatory system: Secondary | ICD-10-CM | POA: Diagnosis not present

## 2020-07-10 DIAGNOSIS — Z96652 Presence of left artificial knee joint: Secondary | ICD-10-CM | POA: Diagnosis not present

## 2020-08-26 DIAGNOSIS — K219 Gastro-esophageal reflux disease without esophagitis: Secondary | ICD-10-CM | POA: Diagnosis not present

## 2020-08-26 DIAGNOSIS — M1710 Unilateral primary osteoarthritis, unspecified knee: Secondary | ICD-10-CM | POA: Diagnosis not present

## 2020-08-26 DIAGNOSIS — N529 Male erectile dysfunction, unspecified: Secondary | ICD-10-CM | POA: Diagnosis not present

## 2020-08-26 DIAGNOSIS — F32A Depression, unspecified: Secondary | ICD-10-CM | POA: Diagnosis not present

## 2020-08-26 DIAGNOSIS — Z8679 Personal history of other diseases of the circulatory system: Secondary | ICD-10-CM | POA: Diagnosis not present

## 2020-08-26 DIAGNOSIS — R413 Other amnesia: Secondary | ICD-10-CM | POA: Diagnosis not present

## 2020-08-26 DIAGNOSIS — E785 Hyperlipidemia, unspecified: Secondary | ICD-10-CM | POA: Diagnosis not present

## 2020-08-26 DIAGNOSIS — E559 Vitamin D deficiency, unspecified: Secondary | ICD-10-CM | POA: Diagnosis not present

## 2020-08-26 DIAGNOSIS — E039 Hypothyroidism, unspecified: Secondary | ICD-10-CM | POA: Diagnosis not present

## 2020-09-10 DIAGNOSIS — N3001 Acute cystitis with hematuria: Secondary | ICD-10-CM | POA: Diagnosis not present

## 2020-09-10 DIAGNOSIS — R3989 Other symptoms and signs involving the genitourinary system: Secondary | ICD-10-CM | POA: Diagnosis not present

## 2020-09-26 DIAGNOSIS — S82401A Unspecified fracture of shaft of right fibula, initial encounter for closed fracture: Secondary | ICD-10-CM | POA: Diagnosis not present

## 2020-09-26 DIAGNOSIS — M7731 Calcaneal spur, right foot: Secondary | ICD-10-CM | POA: Diagnosis not present

## 2020-09-26 DIAGNOSIS — M19071 Primary osteoarthritis, right ankle and foot: Secondary | ICD-10-CM | POA: Diagnosis not present

## 2020-09-26 DIAGNOSIS — M79671 Pain in right foot: Secondary | ICD-10-CM | POA: Diagnosis not present

## 2020-10-01 ENCOUNTER — Other Ambulatory Visit: Payer: Self-pay

## 2020-10-02 ENCOUNTER — Encounter: Payer: Self-pay | Admitting: Cardiology

## 2020-10-02 ENCOUNTER — Ambulatory Visit: Payer: Medicare HMO | Admitting: Cardiology

## 2020-10-02 ENCOUNTER — Other Ambulatory Visit: Payer: Self-pay

## 2020-10-02 VITALS — BP 136/88 | HR 80 | Ht 74.0 in | Wt 277.0 lb

## 2020-10-02 DIAGNOSIS — J449 Chronic obstructive pulmonary disease, unspecified: Secondary | ICD-10-CM | POA: Diagnosis not present

## 2020-10-02 DIAGNOSIS — I77810 Thoracic aortic ectasia: Secondary | ICD-10-CM

## 2020-10-02 DIAGNOSIS — I10 Essential (primary) hypertension: Secondary | ICD-10-CM

## 2020-10-02 DIAGNOSIS — E785 Hyperlipidemia, unspecified: Secondary | ICD-10-CM

## 2020-10-02 DIAGNOSIS — I428 Other cardiomyopathies: Secondary | ICD-10-CM

## 2020-10-02 DIAGNOSIS — N529 Male erectile dysfunction, unspecified: Secondary | ICD-10-CM | POA: Diagnosis not present

## 2020-10-02 MED ORDER — SILDENAFIL CITRATE 100 MG PO TABS: 100.0000 mg | ORAL_TABLET | Freq: Every day | ORAL | 0 refills | Status: DC | PRN

## 2020-10-02 NOTE — Progress Notes (Signed)
Cardiology Office Note:    Date:  10/02/2020   ID:  DWAINE PESQUEIRA, DOB Feb 11, 1940, MRN [id number]  PCP:  [name], MD  Cardiologist:  Jenean Lindau, MD   Referring MD: [name], MD    ASSESSMENT:    1. Essential hypertension   2. Ascending aorta dilatation (HCC)   3. Dyslipidemia   4. Other cardiomyopathy ([place])    PLAN:    In order of problems listed above:  Primary prevention stressed with the patient.  Importance of compliance with diet medication stressed any vocalized understanding.  He was advised to walk at least half an hour a day on a daily basis and he promises to do so. Cardiomyopathy: Mildly reduced ejection fraction.  We will follow-up with echocardiogram.  He is not keen on any medications or any intervention such as medications like Entresto.  I respect his wishes.  He tells me that he is asymptomatic and doing well.  Benefits and potential is explained and he understands.   Essential hypertension: Blood pressure stable and diet was emphasized. Ascending aortic dilatation: We will do CT of chest with contrast to monitor this.  I discussed this with him Erectile dysfunction: Patient's effort tolerance is fine.  Overall he is asymptomatic from walks on a regular basis.  Every give him a prescription of Viagra 100 mg to be used on a as needed basis.  Precautions advised.  I told him never to use any form of nitroglycerin in a 24-hour period before or after using this medications and he repeated my advice and vocalized complete understanding. Patient will be seen in follow-up appointment in 6 months or earlier if the patient has any concerns    Medication Adjustments/Labs and Tests Ordered: Current medicines are reviewed at length with the patient today.  Concerns regarding medicines are outlined above.  No orders of the defined types were placed in this encounter.  No orders of the defined types were placed in this encounter.    No chief complaint on  file.    History of Present Illness:    BERNON SURTI is a 80 y.o. male.  Patient has past medical history of mildly depressed ejection fraction, essential hypertension, dilated ascending aorta.  He denies any problems at this time.  He has significant issues with memory.  He has issues with dementia.  No chest pain orthopnea or PND.  At the time of my evaluation, the patient is alert awake oriented and in no distress.  He also gives history of erectile dysfunction and requests medications for this.  Past Medical History:  Diagnosis Date   Acute hypoxemic respiratory failure due to severe acute respiratory syndrome coronavirus 2 (SARS-CoV-2) disease ([place]) 03/04/2019   Allergic rhinosinusitis    Ascending aorta dilatation (HCC) 05/25/2019   Back pain 03/08/2019   BMI 33.0-33.9,adult    Bronchial asthma    Cardiomyopathy ([place]) 05/25/2019   Chest pain    Constipation, chronic    COPD (chronic obstructive pulmonary disease) (HCC)    Dementia (HCC)    Dyslipidemia    Essential hypertension    Family history of diabetes mellitus    Fatigue    GERD (gastroesophageal reflux disease)    Hyperlipemia    Hypothyroid 03/08/2019   Insomnia    Knee pain, bilateral    Mild depression (HCC)    Mild memory disturbance    Obesity (BMI 30-39.9)    Palpitations 03/23/2019   Pedal edema    Pneumonia due to  COVID-19 virus 03/08/2019   Preoperative cardiovascular examination 11/13/2019   Thoracic aorta atherosclerosis ([place])    Thoracic aortic aneurysm without rupture (HCC)    Vitamin D deficiency     Past Surgical History:  Procedure Laterality Date   NO PAST SURGERIES      Current Medications: Current Meds  Medication Sig   acetaminophen (TYLENOL) 325 MG tablet Take 2 tablets (650 mg total) by mouth every 6 (six) hours as needed for moderate pain (back pain).   amitriptyline (ELAVIL) 100 MG tablet Take 100 mg by mouth at bedtime.   atorvastatin (LIPITOR) 10 MG tablet Take 10 mg by mouth daily.    celecoxib (CELEBREX) 200 MG capsule Take 200 mg by mouth 2 (two) times daily.   cetirizine (ZYRTEC) 10 MG tablet Take 10 mg by mouth daily.   donepezil (ARICEPT) 10 MG tablet Take 10 mg by mouth at bedtime.   ergocalciferol (VITAMIN D2) 1.25 MG (50000 UT) capsule Take 50,000 Units by mouth every Wednesday.    esomeprazole (NEXIUM) 40 MG capsule Take 40 mg by mouth daily before breakfast.    fluticasone (FLONASE) 50 MCG/ACT nasal spray Place 1 spray into both nostrils 2 (two) times daily.    levothyroxine (SYNTHROID) 125 MCG tablet Take 125 mcg by mouth daily before breakfast.   lubiprostone (AMITIZA) 24 MCG capsule Take 24 mcg by mouth 2 (two) times daily with a meal.   memantine (NAMENDA) 10 MG tablet Take 10 mg by mouth at bedtime.    metoprolol succinate (TOPROL-XL) 25 MG 24 hr tablet Take 12.5 mg by mouth daily.   montelukast (SINGULAIR) 10 MG tablet Take 10 mg by mouth daily.    nitroGLYCERIN (NITROSTAT) 0.4 MG SL tablet Place 0.4 mg under the tongue every 5 (five) minutes as needed for chest pain.   omeprazole (PRILOSEC) 40 MG capsule Take 40 mg by mouth daily.   polyethylene glycol (MIRALAX / GLYCOLAX) 17 g packet Take 17 g by mouth every evening.    Propylene Glycol (SYSTANE BALANCE) 0.6 % SOLN Place 1 drop into both eyes 3 (three) times daily as needed for dry eyes (dry/irritated eyes.).     Allergies:   Patient has no known allergies.   Social History   Socioeconomic History   Marital status: Married    Spouse name: [place]   Number of children: 2   Years of education: Not on file   Highest education level: Not on file  Occupational History   Occupation: retired  Tobacco Use   Smoking status: Former   Smokeless tobacco: Never  Scientific laboratory technician Use: Never used  Substance and Sexual Activity   Alcohol use: Not Currently   Drug use: Never   Sexual activity: Not Currently  Other Topics Concern   Not on file  Social History Narrative   Not on file   Social  Determinants of Health   Financial Resource Strain: Not on file  Food Insecurity: Not on file  Transportation Needs: Not on file  Physical Activity: Not on file  Stress: Not on file  Social Connections: Not on file     Family History: The patient's family history includes Cancer in his brother, brother, sister, and sister; Diabetes in his sister and sister.  ROS:   Please see the history of present illness.    All other systems reviewed and are negative.  EKGs/Labs/Other Studies Reviewed:    The following studies were reviewed today: I discussed my findings with the patient at  length.   Recent Labs: No results found for requested labs within last 8760 hours.  Recent Lipid Panel    Component Value Date/Time   CHOL 153 05/25/2019 0924   TRIG 93 05/25/2019 0924   HDL 35 (L) 05/25/2019 0924   CHOLHDL 4.4 05/25/2019 0924   LDLCALC 100 (H) 05/25/2019 0924    Physical Exam:    VS:  BP 136/88   Pulse 80   Ht '6\' 2"'$  (1.88 m)   Wt 277 lb (125.6 kg)   SpO2 95%   BMI 35.56 kg/m     Wt Readings from Last 3 Encounters:  10/02/20 277 lb (125.6 kg)  11/13/19 266 lb (120.7 kg)  05/25/19 263 lb 9.6 oz (119.6 kg)     GEN: Patient is in no acute distress HEENT: Normal NECK: No JVD; No carotid bruits LYMPHATICS: No lymphadenopathy CARDIAC: Hear sounds regular, 2/6 systolic murmur at the apex. RESPIRATORY:  Clear to auscultation without rales, wheezing or rhonchi  ABDOMEN: Soft, non-tender, non-distended MUSCULOSKELETAL:  No edema; No deformity  SKIN: Warm and dry NEUROLOGIC:  Alert and oriented x 3 PSYCHIATRIC:  Normal affect   Signed, Jenean Lindau, MD  10/02/2020 3:41 PM    [facility]

## 2020-10-02 NOTE — Addendum Note (Signed)
Addended by: Truddie Hidden on: 10/02/2020 03:53 PM   Modules accepted: Orders

## 2020-10-02 NOTE — Patient Instructions (Signed)
Medication Instructions:  Your physician has recommended you make the following change in your medication:   Take 100 mg Viagra as needed.  *If you need a refill on your cardiac medications before your next appointment, please call your pharmacy*   Lab Work: None ordered If you have labs (blood work) drawn today and your tests are completely normal, you will receive your results only by: [place] (if you have MyChart) OR A paper copy in the mail If you have any lab test that is abnormal or we need to change your treatment, we will call you to review the results.   Testing/Procedures: Your physician has requested that you have an echocardiogram. Echocardiography is a painless test that uses sound waves to create images of your heart. It provides your doctor with information about the size and shape of your heart and how well your heart's chambers and valves are working. This procedure takes approximately one hour. There are no restrictions for this procedure.  Non-Cardiac CT Angiography (CTA), is a special type of CT scan that uses a computer to produce multi-dimensional views of major blood vessels throughout the body. In CT angiography, a contrast material is injected through an IV to help visualize the blood vessels. This will be done at [facility].  Follow-Up: At [facility], you and your health needs are our priority.  As part of our continuing mission to provide you with exceptional heart care, we have created designated Provider Care Teams.  These Care Teams include your primary Cardiologist (physician) and Advanced Practice Providers (APPs -  Physician Assistants and Nurse Practitioners) who all work together to provide you with the care you need, when you need it.  We recommend signing up for the patient portal called "MyChart".  Sign up information is provided on this After Visit Summary.  MyChart is used to connect with patients for Virtual Visits (Telemedicine).   Patients are able to view lab/test results, encounter notes, upcoming appointments, etc.  Non-urgent messages can be sent to your provider as well.   To learn more about what you can do with MyChart, go to [email or web address].    Your next appointment:   6 month(s)  The format for your next appointment:   In Person  Provider:   Jyl Heinz, MD   Other Instructions Echocardiogram An echocardiogram is a test that uses sound waves (ultrasound) to produce images of the heart. Images from an echocardiogram can provide important information about: Heart size and shape. The size and thickness and movement of your heart's walls. Heart muscle function and strength. Heart valve function or if you have stenosis. Stenosis is when the heart valves are too narrow. If blood is flowing backward through the heart valves (regurgitation). A tumor or infectious growth around the heart valves. Areas of heart muscle that are not working well because of poor blood flow or injury from a heart attack. Aneurysm detection. An aneurysm is a weak or damaged part of an artery wall. The wall bulges out from the normal force of blood pumping through the body. Tell a health care provider about: Any allergies you have. All medicines you are taking, including vitamins, herbs, eye drops, creams, and over-the-counter medicines. Any blood disorders you have. Any surgeries you have had. Any medical conditions you have. Whether you are pregnant or may be pregnant. What are the risks? Generally, this is a safe test. However, problems may occur, including an allergic reaction to dye (contrast) that may be used during the  test. What happens before the test? No specific preparation is needed. You may eat and drink normally. What happens during the test? You will take off your clothes from the waist up and put on a hospital gown. Electrodes or electrocardiogram (ECG)patches may be placed on your chest. The  electrodes or patches are then connected to a device that monitors your heart rate and rhythm. You will lie down on a table for an ultrasound exam. A gel will be applied to your chest to help sound waves pass through your skin. A handheld device, called a transducer, will be pressed against your chest and moved over your heart. The transducer produces sound waves that travel to your heart and bounce back (or "echo" back) to the transducer. These sound waves will be captured in real-time and changed into images of your heart that can be viewed on a video monitor. The images will be recorded on a computer and reviewed by your health care provider. You may be asked to change positions or hold your breath for a short time. This makes it easier to get different views or better views of your heart. In some cases, you may receive contrast through an IV in one of your veins. This can improve the quality of the pictures from your heart. The procedure may vary among health care providers and hospitals.   What can I expect after the test? You may return to your normal, everyday life, including diet, activities, and medicines, unless your health care provider tells you not to do that. Follow these instructions at home: It is up to you to get the results of your test. Ask your health care provider, or the department that is doing the test, when your results will be ready. Keep all follow-up visits. This is important. Summary An echocardiogram is a test that uses sound waves (ultrasound) to produce images of the heart. Images from an echocardiogram can provide important information about the size and shape of your heart, heart muscle function, heart valve function, and other possible heart problems. You do not need to do anything to prepare before this test. You may eat and drink normally. After the echocardiogram is completed, you may return to your normal, everyday life, unless your health care provider tells you  not to do that. This information is not intended to replace advice given to you by your health care provider. Make sure you discuss any questions you have with your health care provider. Document Revised: 09/18/2019 Document Reviewed: 09/18/2019 Elsevier Patient Education  2021 [employer].

## 2020-10-14 DIAGNOSIS — E669 Obesity, unspecified: Secondary | ICD-10-CM | POA: Diagnosis not present

## 2020-10-14 DIAGNOSIS — E785 Hyperlipidemia, unspecified: Secondary | ICD-10-CM | POA: Diagnosis not present

## 2020-10-14 DIAGNOSIS — Z0000 Encounter for general adult medical examination without abnormal findings: Secondary | ICD-10-CM | POA: Diagnosis not present

## 2020-10-14 DIAGNOSIS — Z1331 Encounter for screening for depression: Secondary | ICD-10-CM | POA: Diagnosis not present

## 2020-10-14 DIAGNOSIS — Z9181 History of falling: Secondary | ICD-10-CM | POA: Diagnosis not present

## 2020-10-17 ENCOUNTER — Other Ambulatory Visit: Payer: Self-pay

## 2020-10-17 ENCOUNTER — Ambulatory Visit (INDEPENDENT_AMBULATORY_CARE_PROVIDER_SITE_OTHER): Payer: Medicare HMO

## 2020-10-17 DIAGNOSIS — I428 Other cardiomyopathies: Secondary | ICD-10-CM

## 2020-10-17 LAB — ECHOCARDIOGRAM COMPLETE
Area-P 1/2: 2.42 cm2
Calc EF: 49.3 %
S' Lateral: 3.6 cm
Single Plane A2C EF: 42.5 %
Single Plane A4C EF: 53.4 %

## 2020-10-21 ENCOUNTER — Telehealth: Payer: Self-pay | Admitting: Cardiology

## 2020-10-21 NOTE — Telephone Encounter (Signed)
Kristy with [facility] states the patient's CT order has been authorized for the [facility] location. She is requesting to have the location changed to [facility] in the order. If questions, a call may be returned to [place] at [phone].

## 2020-10-21 NOTE — Telephone Encounter (Signed)
Resent to pre-cert.

## 2020-10-23 DIAGNOSIS — R911 Solitary pulmonary nodule: Secondary | ICD-10-CM | POA: Diagnosis not present

## 2020-10-23 DIAGNOSIS — I712 Thoracic aortic aneurysm, without rupture: Secondary | ICD-10-CM | POA: Diagnosis not present

## 2020-10-23 DIAGNOSIS — J9811 Atelectasis: Secondary | ICD-10-CM | POA: Diagnosis not present

## 2020-10-23 DIAGNOSIS — R918 Other nonspecific abnormal finding of lung field: Secondary | ICD-10-CM | POA: Diagnosis not present

## 2020-10-23 DIAGNOSIS — I700 Atherosclerosis of aorta: Secondary | ICD-10-CM | POA: Diagnosis not present

## 2020-11-04 NOTE — Telephone Encounter (Signed)
Called pt and informed him that his CT chest showed stable aneurysm. Pt aware of incidental finding of lung nodule. Report sent to Dr. Rica Records.

## 2020-11-10 DIAGNOSIS — Z20822 Contact with and (suspected) exposure to covid-19: Secondary | ICD-10-CM | POA: Diagnosis not present

## 2020-11-10 DIAGNOSIS — J449 Chronic obstructive pulmonary disease, unspecified: Secondary | ICD-10-CM | POA: Diagnosis not present

## 2020-11-10 DIAGNOSIS — R300 Dysuria: Secondary | ICD-10-CM | POA: Diagnosis not present

## 2020-11-17 DIAGNOSIS — Z0100 Encounter for examination of eyes and vision without abnormal findings: Secondary | ICD-10-CM | POA: Diagnosis not present

## 2020-11-17 DIAGNOSIS — Z961 Presence of intraocular lens: Secondary | ICD-10-CM | POA: Diagnosis not present

## 2020-12-10 DIAGNOSIS — E039 Hypothyroidism, unspecified: Secondary | ICD-10-CM | POA: Diagnosis not present

## 2020-12-10 DIAGNOSIS — G4700 Insomnia, unspecified: Secondary | ICD-10-CM | POA: Diagnosis not present

## 2020-12-10 DIAGNOSIS — K219 Gastro-esophageal reflux disease without esophagitis: Secondary | ICD-10-CM | POA: Diagnosis not present

## 2020-12-10 DIAGNOSIS — Z8679 Personal history of other diseases of the circulatory system: Secondary | ICD-10-CM | POA: Diagnosis not present

## 2020-12-10 DIAGNOSIS — E785 Hyperlipidemia, unspecified: Secondary | ICD-10-CM | POA: Diagnosis not present

## 2020-12-10 DIAGNOSIS — F32A Depression, unspecified: Secondary | ICD-10-CM | POA: Diagnosis not present

## 2020-12-10 DIAGNOSIS — E559 Vitamin D deficiency, unspecified: Secondary | ICD-10-CM | POA: Diagnosis not present

## 2020-12-10 DIAGNOSIS — R413 Other amnesia: Secondary | ICD-10-CM | POA: Diagnosis not present

## 2020-12-10 DIAGNOSIS — M179 Osteoarthritis of knee, unspecified: Secondary | ICD-10-CM | POA: Diagnosis not present

## 2020-12-11 DIAGNOSIS — E559 Vitamin D deficiency, unspecified: Secondary | ICD-10-CM | POA: Diagnosis not present

## 2020-12-11 DIAGNOSIS — E538 Deficiency of other specified B group vitamins: Secondary | ICD-10-CM | POA: Diagnosis not present

## 2020-12-11 DIAGNOSIS — Z79899 Other long term (current) drug therapy: Secondary | ICD-10-CM | POA: Diagnosis not present

## 2020-12-11 DIAGNOSIS — R37 Sexual dysfunction, unspecified: Secondary | ICD-10-CM | POA: Diagnosis not present

## 2020-12-11 DIAGNOSIS — E785 Hyperlipidemia, unspecified: Secondary | ICD-10-CM | POA: Diagnosis not present

## 2021-01-12 DIAGNOSIS — Z96652 Presence of left artificial knee joint: Secondary | ICD-10-CM | POA: Diagnosis not present

## 2021-03-10 DIAGNOSIS — R079 Chest pain, unspecified: Secondary | ICD-10-CM | POA: Diagnosis not present

## 2021-03-10 DIAGNOSIS — E559 Vitamin D deficiency, unspecified: Secondary | ICD-10-CM | POA: Diagnosis not present

## 2021-03-10 DIAGNOSIS — E785 Hyperlipidemia, unspecified: Secondary | ICD-10-CM | POA: Diagnosis not present

## 2021-03-10 DIAGNOSIS — M159 Polyosteoarthritis, unspecified: Secondary | ICD-10-CM | POA: Diagnosis not present

## 2021-03-10 DIAGNOSIS — E039 Hypothyroidism, unspecified: Secondary | ICD-10-CM | POA: Diagnosis not present

## 2021-03-10 DIAGNOSIS — M179 Osteoarthritis of knee, unspecified: Secondary | ICD-10-CM | POA: Diagnosis not present

## 2021-03-10 DIAGNOSIS — R0789 Other chest pain: Secondary | ICD-10-CM | POA: Diagnosis not present

## 2021-03-10 DIAGNOSIS — K219 Gastro-esophageal reflux disease without esophagitis: Secondary | ICD-10-CM | POA: Diagnosis not present

## 2021-03-10 DIAGNOSIS — M519 Unspecified thoracic, thoracolumbar and lumbosacral intervertebral disc disorder: Secondary | ICD-10-CM | POA: Diagnosis not present

## 2021-03-10 DIAGNOSIS — R413 Other amnesia: Secondary | ICD-10-CM | POA: Diagnosis not present

## 2021-03-10 DIAGNOSIS — F32A Depression, unspecified: Secondary | ICD-10-CM | POA: Diagnosis not present

## 2021-03-10 DIAGNOSIS — Z8679 Personal history of other diseases of the circulatory system: Secondary | ICD-10-CM | POA: Diagnosis not present

## 2021-04-08 DIAGNOSIS — G4700 Insomnia, unspecified: Secondary | ICD-10-CM | POA: Diagnosis not present

## 2021-04-08 DIAGNOSIS — Z8679 Personal history of other diseases of the circulatory system: Secondary | ICD-10-CM | POA: Diagnosis not present

## 2021-04-08 DIAGNOSIS — E039 Hypothyroidism, unspecified: Secondary | ICD-10-CM | POA: Diagnosis not present

## 2021-04-08 DIAGNOSIS — F32A Depression, unspecified: Secondary | ICD-10-CM | POA: Diagnosis not present

## 2021-04-08 DIAGNOSIS — I10 Essential (primary) hypertension: Secondary | ICD-10-CM | POA: Diagnosis not present

## 2021-04-08 DIAGNOSIS — K219 Gastro-esophageal reflux disease without esophagitis: Secondary | ICD-10-CM | POA: Diagnosis not present

## 2021-04-08 DIAGNOSIS — E559 Vitamin D deficiency, unspecified: Secondary | ICD-10-CM | POA: Diagnosis not present

## 2021-04-08 DIAGNOSIS — M179 Osteoarthritis of knee, unspecified: Secondary | ICD-10-CM | POA: Diagnosis not present

## 2021-04-08 DIAGNOSIS — E785 Hyperlipidemia, unspecified: Secondary | ICD-10-CM | POA: Diagnosis not present

## 2021-04-13 ENCOUNTER — Other Ambulatory Visit: Payer: Self-pay

## 2021-04-13 ENCOUNTER — Other Ambulatory Visit: Payer: Self-pay | Admitting: Cardiology

## 2021-04-13 ENCOUNTER — Ambulatory Visit: Payer: Medicare HMO | Admitting: Cardiology

## 2021-04-13 ENCOUNTER — Encounter: Payer: Self-pay | Admitting: Cardiology

## 2021-04-13 VITALS — BP 144/76 | HR 73 | Ht 74.0 in | Wt 267.4 lb

## 2021-04-13 DIAGNOSIS — I2510 Atherosclerotic heart disease of native coronary artery without angina pectoris: Secondary | ICD-10-CM

## 2021-04-13 DIAGNOSIS — I77810 Thoracic aortic ectasia: Secondary | ICD-10-CM

## 2021-04-13 DIAGNOSIS — I7121 Aneurysm of the ascending aorta, without rupture: Secondary | ICD-10-CM | POA: Diagnosis not present

## 2021-04-13 DIAGNOSIS — I429 Cardiomyopathy, unspecified: Secondary | ICD-10-CM | POA: Diagnosis not present

## 2021-04-13 DIAGNOSIS — E782 Mixed hyperlipidemia: Secondary | ICD-10-CM

## 2021-04-13 DIAGNOSIS — I10 Essential (primary) hypertension: Secondary | ICD-10-CM

## 2021-04-13 DIAGNOSIS — I700 Atherosclerosis of aorta: Secondary | ICD-10-CM

## 2021-04-13 DIAGNOSIS — I2584 Coronary atherosclerosis due to calcified coronary lesion: Secondary | ICD-10-CM

## 2021-04-13 DIAGNOSIS — J449 Chronic obstructive pulmonary disease, unspecified: Secondary | ICD-10-CM | POA: Diagnosis not present

## 2021-04-13 HISTORY — DX: Atherosclerotic heart disease of native coronary artery without angina pectoris: I25.10

## 2021-04-13 NOTE — Progress Notes (Signed)
?Cardiology Office Note:   ? ?Date:  04/13/2021  ? ?ID:  Johnny Compton, DOB [birth year], MRN [id number] ? ?PCP:  [name], MD  ?Cardiologist:  Jenean Lindau, MD  ? ?Referring MD: [name], MD  ? ? ?ASSESSMENT:   ? ?1. Ascending aorta dilatation (HCC)   ?2. Cardiomyopathy, unspecified type ([place])   ?3. Essential hypertension   ?4. Mixed hyperlipidemia   ?5. Thoracic aorta atherosclerosis ([place])   ?6. Aneurysm of ascending aorta without rupture   ?7. Coronary artery calcification   ? ?PLAN:   ? ?In order of problems listed above: ? ?Coronary artery calcification: Secondary prevention stressed to the patient.  Importance of compliance with diet medication stressed and she vocalized understanding.  He was advised to walk at least half an hour a day on a daily basis and he promises to do so. ?Essential hypertension: Blood pressure stable and diet was emphasized.  His blood pressures at home are fine. ?Mixed dyslipidemia: Lipids were reviewed from [place] and they are fine and this will be followed by primary care. ?Ascending aortic aneurysm: Stable at this time.  Noncardiac findings will be followed by primary care.  We will monitor the aneurysm by doing follow-up study at the time of his next visit in 9 months.  He has no symptoms at this time. ?Patient will be seen in follow-up appointment in 6 months or earlier if the patient has any concerns ? ? ? ?Medication Adjustments/Labs and Tests Ordered: ?Current medicines are reviewed at length with the patient today.  Concerns regarding medicines are outlined above.  ?No orders of the defined types were placed in this encounter. ? ?No orders of the defined types were placed in this encounter. ? ? ? ?No chief complaint on file. ?  ? ?History of Present Illness:   ? ?Johnny Compton is a 81 y.o. male.  Patient has past medical history of coronary artery calcification, aortic atherosclerosis, ascending arctic aneurysm, essential hypertension and dyslipidemia.  He denies  any problems at this time and takes care of activities of daily living.  Overall he leads a sedentary lifestyle.  No chest pain orthopnea or PND.  At the time of my evaluation, the patient is alert awake oriented and in no distress. ? ?Past Medical History:  ?Diagnosis Date  ? Acute hypoxemic respiratory failure due to severe acute respiratory syndrome coronavirus 2 (SARS-CoV-2) disease ([place]) 03/04/2019  ? Allergic rhinosinusitis   ? Ascending aorta dilatation ([place]) 05/25/2019  ? Back pain 03/08/2019  ? BMI 33.0-33.9,adult   ? Bronchial asthma   ? Cardiomyopathy ([place]) 05/25/2019  ? Chest pain   ? Constipation, chronic   ? COPD (chronic obstructive pulmonary disease) ([place])   ? Dementia ([place])   ? Dyslipidemia   ? Essential hypertension   ? Family history of diabetes mellitus   ? Fatigue   ? GERD (gastroesophageal reflux disease)   ? Hyperlipemia   ? Hypothyroid 03/08/2019  ? Insomnia   ? Knee pain, bilateral   ? Mild depression   ? Mild memory disturbance   ? Obesity (BMI 30-39.9)   ? Palpitations 03/23/2019  ? Pedal edema   ? Pneumonia due to COVID-19 virus 03/08/2019  ? Preoperative cardiovascular examination 11/13/2019  ? Thoracic aorta atherosclerosis ([place])   ? Thoracic aortic aneurysm without rupture   ? Vitamin D deficiency   ? ? ?Past Surgical History:  ?Procedure Laterality Date  ? NO PAST SURGERIES    ? ? ?Current Medications: ?  Current Meds  ?Medication Sig  ? acetaminophen (TYLENOL) 325 MG tablet Take 2 tablets (650 mg total) by mouth every 6 (six) hours as needed for moderate pain (back pain).  ? atorvastatin (LIPITOR) 10 MG tablet Take 10 mg by mouth daily.  ? B Complex Vitamins (B COMPLEX 1 PO) Take 1 tablet by mouth daily.  ? celecoxib (CELEBREX) 200 MG capsule Take 200 mg by mouth daily.  ? cetirizine (ZYRTEC) 10 MG tablet Take 10 mg by mouth daily.  ? donepezil (ARICEPT) 10 MG tablet Take 10 mg by mouth at bedtime.  ? ergocalciferol (VITAMIN D2) 1.25 MG (50000 UT) capsule Take 50,000 Units by mouth every  Wednesday.   ? furosemide (LASIX) 20 MG tablet Take 20 mg by mouth as needed for edema or fluid.  ? gabapentin (NEURONTIN) 100 MG capsule Take 100 mg by mouth 3 (three) times daily.  ? levothyroxine (SYNTHROID) 125 MCG tablet Take 125 mcg by mouth daily before breakfast.  ? memantine (NAMENDA) 10 MG tablet Take 10 mg by mouth at bedtime.   ? montelukast (SINGULAIR) 10 MG tablet Take 10 mg by mouth daily.   ? Multiple Vitamin (MULTIVITAMIN PO) Take 1 tablet by mouth daily.  ? nitroGLYCERIN (NITROSTAT) 0.4 MG SL tablet Place 0.4 mg under the tongue every 5 (five) minutes as needed for chest pain.  ? omeprazole (PRILOSEC) 40 MG capsule Take 40 mg by mouth daily.  ? tiZANidine (ZANAFLEX) 2 MG tablet Take 2 mg by mouth 2 (two) times daily.  ?  ? ?Allergies:   Patient has no known allergies.  ? ?Social History  ? ?Socioeconomic History  ? Marital status: Married  ?  Spouse name: Yolanda  ? Number of children: 2  ? Years of education: Not on file  ? Highest education level: Not on file  ?Occupational History  ? Occupation: retired  ?Tobacco Use  ? Smoking status: Former  ? Smokeless tobacco: Never  ?Vaping Use  ? Vaping Use: Never used  ?Substance and Sexual Activity  ? Alcohol use: Not Currently  ? Drug use: Never  ? Sexual activity: Not Currently  ?Other Topics Concern  ? Not on file  ?Social History Narrative  ? Not on file  ? ?Social Determinants of Health  ? ?Financial Resource Strain: Not on file  ?Food Insecurity: Not on file  ?Transportation Needs: Not on file  ?Physical Activity: Not on file  ?Stress: Not on file  ?Social Connections: Not on file  ?  ? ?Family History: ?The patient's family history includes Cancer in his brother, brother, sister, and sister; Diabetes in his sister and sister. ? ?ROS:   ?Please see the history of present illness.    ?All other systems reviewed and are negative. ? ?EKGs/Labs/Other Studies Reviewed:   ? ?The following studies were reviewed today: ?I discussed my findings with the  patient at length ? ? ?Recent Labs: ?No results found for requested labs within last 8760 hours.  ?Recent Lipid Panel ?   ?Component Value Date/Time  ? CHOL 153 05/25/2019 0924  ? TRIG 93 05/25/2019 0924  ? HDL 35 (L) 05/25/2019 0924  ? CHOLHDL 4.4 05/25/2019 0924  ? [place] 100 (H) 05/25/2019 2637  ? ? ?Physical Exam:   ? ?VS:  BP (!) 144/76   Pulse 73   Ht '6\' 2"'$  (1.88 m)   Wt 267 lb 6.4 oz (121.3 kg)   SpO2 98%   BMI 34.33 kg/m?    ? ?Wt Readings from Last  3 Encounters:  ?04/13/21 267 lb 6.4 oz (121.3 kg)  ?10/02/20 277 lb (125.6 kg)  ?11/13/19 266 lb (120.7 kg)  ?  ? ?GEN: Patient is in no acute distress ?HEENT: Normal ?NECK: No JVD; No carotid bruits ?LYMPHATICS: No lymphadenopathy ?CARDIAC: Hear sounds regular, 2/6 systolic murmur at the apex. ?RESPIRATORY:  Clear to auscultation without rales, wheezing or rhonchi  ?ABDOMEN: Soft, non-tender, non-distended ?MUSCULOSKELETAL:  No edema; No deformity  ?SKIN: Warm and dry ?NEUROLOGIC:  Alert and oriented x 3 ?PSYCHIATRIC:  Normal affect  ? ?Signed, ?Jenean Lindau, MD  ?04/13/2021 1:57 PM    ?[place]  ?

## 2021-04-13 NOTE — Patient Instructions (Signed)

## 2021-05-08 DIAGNOSIS — E785 Hyperlipidemia, unspecified: Secondary | ICD-10-CM | POA: Diagnosis not present

## 2021-05-08 DIAGNOSIS — E039 Hypothyroidism, unspecified: Secondary | ICD-10-CM | POA: Diagnosis not present

## 2021-05-08 DIAGNOSIS — K219 Gastro-esophageal reflux disease without esophagitis: Secondary | ICD-10-CM | POA: Diagnosis not present

## 2021-07-08 DIAGNOSIS — Z8679 Personal history of other diseases of the circulatory system: Secondary | ICD-10-CM | POA: Diagnosis not present

## 2021-07-08 DIAGNOSIS — G4700 Insomnia, unspecified: Secondary | ICD-10-CM | POA: Diagnosis not present

## 2021-07-08 DIAGNOSIS — K219 Gastro-esophageal reflux disease without esophagitis: Secondary | ICD-10-CM | POA: Diagnosis not present

## 2021-07-08 DIAGNOSIS — E039 Hypothyroidism, unspecified: Secondary | ICD-10-CM | POA: Diagnosis not present

## 2021-07-08 DIAGNOSIS — E538 Deficiency of other specified B group vitamins: Secondary | ICD-10-CM | POA: Diagnosis not present

## 2021-07-08 DIAGNOSIS — E785 Hyperlipidemia, unspecified: Secondary | ICD-10-CM | POA: Diagnosis not present

## 2021-07-08 DIAGNOSIS — E559 Vitamin D deficiency, unspecified: Secondary | ICD-10-CM | POA: Diagnosis not present

## 2021-07-08 DIAGNOSIS — R600 Localized edema: Secondary | ICD-10-CM | POA: Diagnosis not present

## 2021-07-08 DIAGNOSIS — R413 Other amnesia: Secondary | ICD-10-CM | POA: Diagnosis not present

## 2021-07-10 DIAGNOSIS — E538 Deficiency of other specified B group vitamins: Secondary | ICD-10-CM | POA: Diagnosis not present

## 2021-07-10 DIAGNOSIS — E785 Hyperlipidemia, unspecified: Secondary | ICD-10-CM | POA: Diagnosis not present

## 2021-07-10 DIAGNOSIS — R37 Sexual dysfunction, unspecified: Secondary | ICD-10-CM | POA: Diagnosis not present

## 2021-07-10 DIAGNOSIS — Z79899 Other long term (current) drug therapy: Secondary | ICD-10-CM | POA: Diagnosis not present

## 2021-07-10 DIAGNOSIS — E559 Vitamin D deficiency, unspecified: Secondary | ICD-10-CM | POA: Diagnosis not present

## 2021-07-10 DIAGNOSIS — F32A Depression, unspecified: Secondary | ICD-10-CM | POA: Diagnosis not present

## 2021-07-10 DIAGNOSIS — E039 Hypothyroidism, unspecified: Secondary | ICD-10-CM | POA: Diagnosis not present

## 2021-09-10 DIAGNOSIS — K219 Gastro-esophageal reflux disease without esophagitis: Secondary | ICD-10-CM | POA: Diagnosis not present

## 2021-09-10 DIAGNOSIS — E785 Hyperlipidemia, unspecified: Secondary | ICD-10-CM | POA: Diagnosis not present

## 2021-09-10 DIAGNOSIS — J45909 Unspecified asthma, uncomplicated: Secondary | ICD-10-CM | POA: Diagnosis not present

## 2021-09-10 DIAGNOSIS — K5909 Other constipation: Secondary | ICD-10-CM | POA: Diagnosis not present

## 2021-09-10 DIAGNOSIS — J309 Allergic rhinitis, unspecified: Secondary | ICD-10-CM | POA: Diagnosis not present

## 2021-09-10 DIAGNOSIS — R269 Unspecified abnormalities of gait and mobility: Secondary | ICD-10-CM | POA: Diagnosis not present

## 2021-09-10 DIAGNOSIS — M5414 Radiculopathy, thoracic region: Secondary | ICD-10-CM | POA: Diagnosis not present

## 2021-09-29 DIAGNOSIS — M6281 Muscle weakness (generalized): Secondary | ICD-10-CM | POA: Diagnosis not present

## 2021-09-29 DIAGNOSIS — R2689 Other abnormalities of gait and mobility: Secondary | ICD-10-CM | POA: Diagnosis not present

## 2021-10-05 DIAGNOSIS — M5414 Radiculopathy, thoracic region: Secondary | ICD-10-CM | POA: Diagnosis not present

## 2021-10-05 DIAGNOSIS — R1310 Dysphagia, unspecified: Secondary | ICD-10-CM | POA: Diagnosis not present

## 2021-10-05 DIAGNOSIS — E785 Hyperlipidemia, unspecified: Secondary | ICD-10-CM | POA: Diagnosis not present

## 2021-10-05 DIAGNOSIS — E039 Hypothyroidism, unspecified: Secondary | ICD-10-CM | POA: Diagnosis not present

## 2021-10-05 DIAGNOSIS — K219 Gastro-esophageal reflux disease without esophagitis: Secondary | ICD-10-CM | POA: Diagnosis not present

## 2021-10-07 DIAGNOSIS — M6281 Muscle weakness (generalized): Secondary | ICD-10-CM | POA: Diagnosis not present

## 2021-10-07 DIAGNOSIS — R2689 Other abnormalities of gait and mobility: Secondary | ICD-10-CM | POA: Diagnosis not present

## 2021-10-08 DIAGNOSIS — K219 Gastro-esophageal reflux disease without esophagitis: Secondary | ICD-10-CM | POA: Diagnosis not present

## 2021-10-08 DIAGNOSIS — K5900 Constipation, unspecified: Secondary | ICD-10-CM | POA: Diagnosis not present

## 2021-10-08 DIAGNOSIS — R1310 Dysphagia, unspecified: Secondary | ICD-10-CM | POA: Diagnosis not present

## 2021-10-08 DIAGNOSIS — K649 Unspecified hemorrhoids: Secondary | ICD-10-CM | POA: Diagnosis not present

## 2021-10-08 DIAGNOSIS — K5790 Diverticulosis of intestine, part unspecified, without perforation or abscess without bleeding: Secondary | ICD-10-CM | POA: Diagnosis not present

## 2021-10-13 DIAGNOSIS — R1310 Dysphagia, unspecified: Secondary | ICD-10-CM | POA: Diagnosis not present

## 2021-10-13 DIAGNOSIS — K2289 Other specified disease of esophagus: Secondary | ICD-10-CM | POA: Diagnosis not present

## 2021-10-14 DIAGNOSIS — M6281 Muscle weakness (generalized): Secondary | ICD-10-CM | POA: Diagnosis not present

## 2021-10-14 DIAGNOSIS — R2689 Other abnormalities of gait and mobility: Secondary | ICD-10-CM | POA: Diagnosis not present

## 2021-10-17 IMAGING — DX DG CHEST 1V
1 series · 2 of 2 positions shown · non-contrast
Comparison: Portable exam 1431 hours compared to 03/03/2019

CLINICAL DATA: Dyspnea, 0LL1V-8Z positive, hypertension, dementia

EXAM:
CHEST  1 VIEW

[Series 1: chest · 0.14mm/px · 2 of 2 slices shown]
[im 1/2]
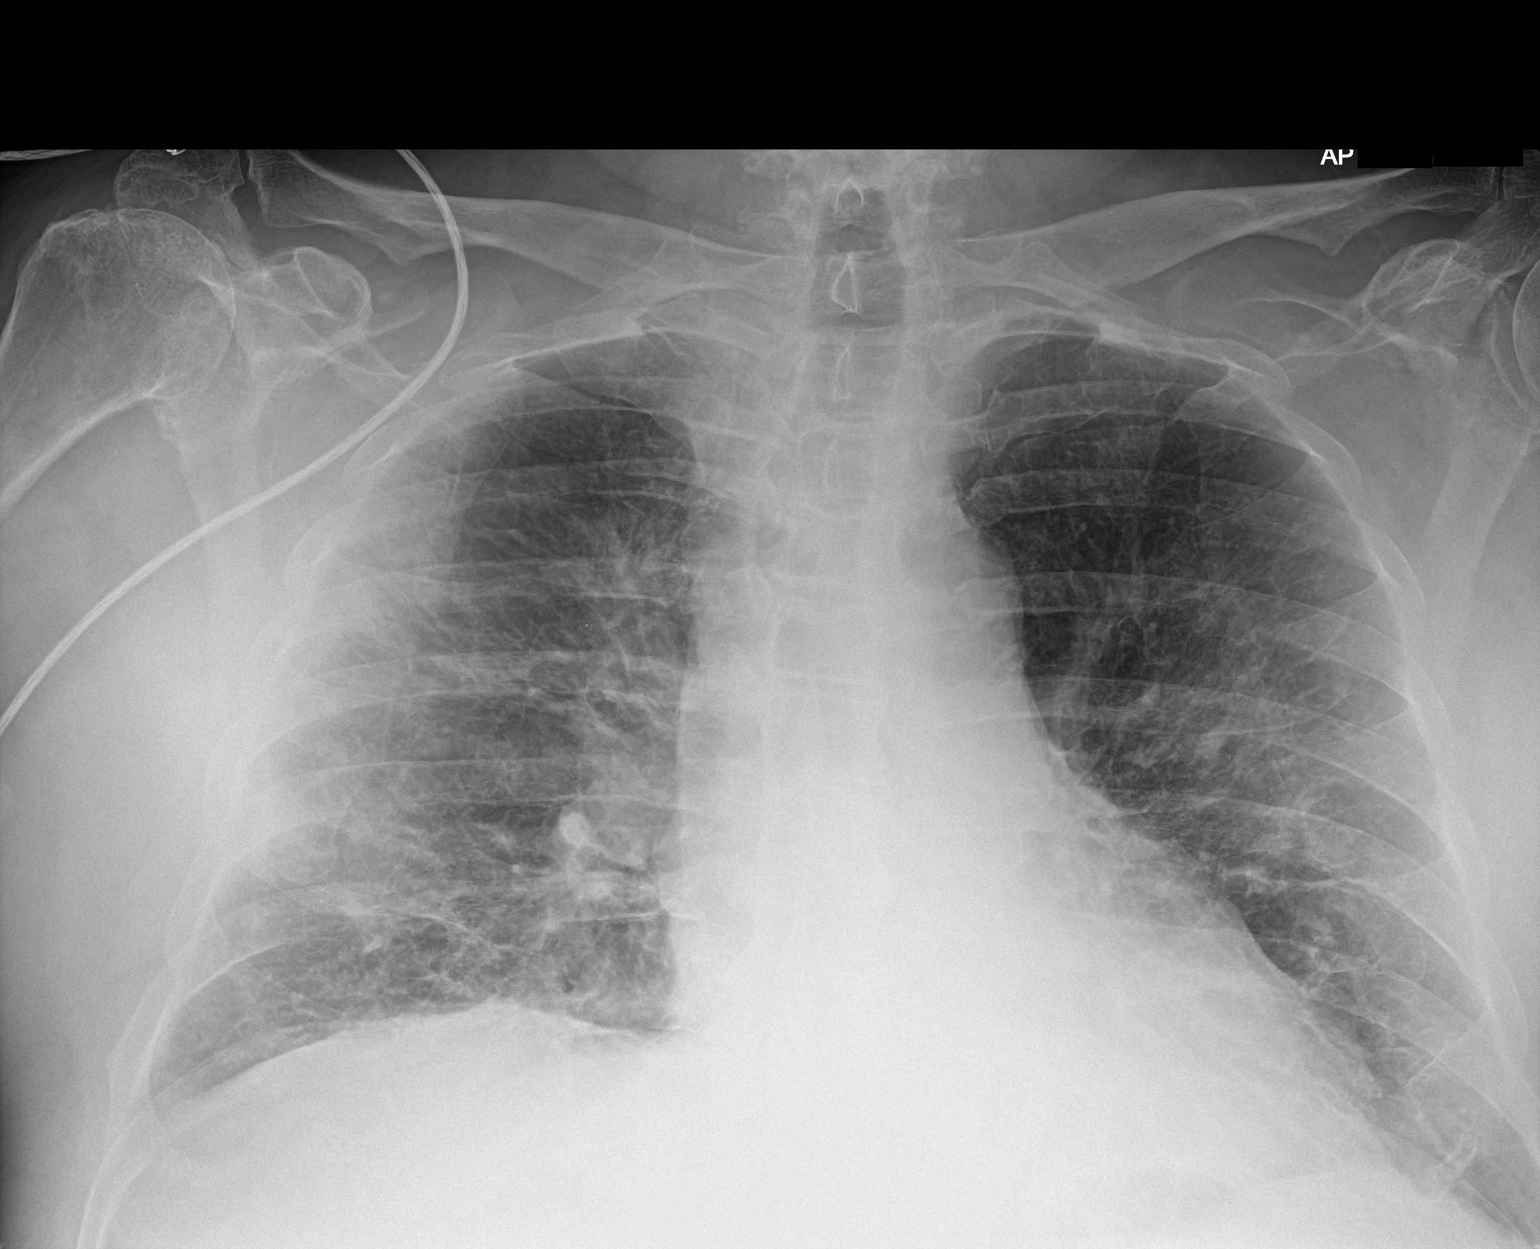
[im 2/2]
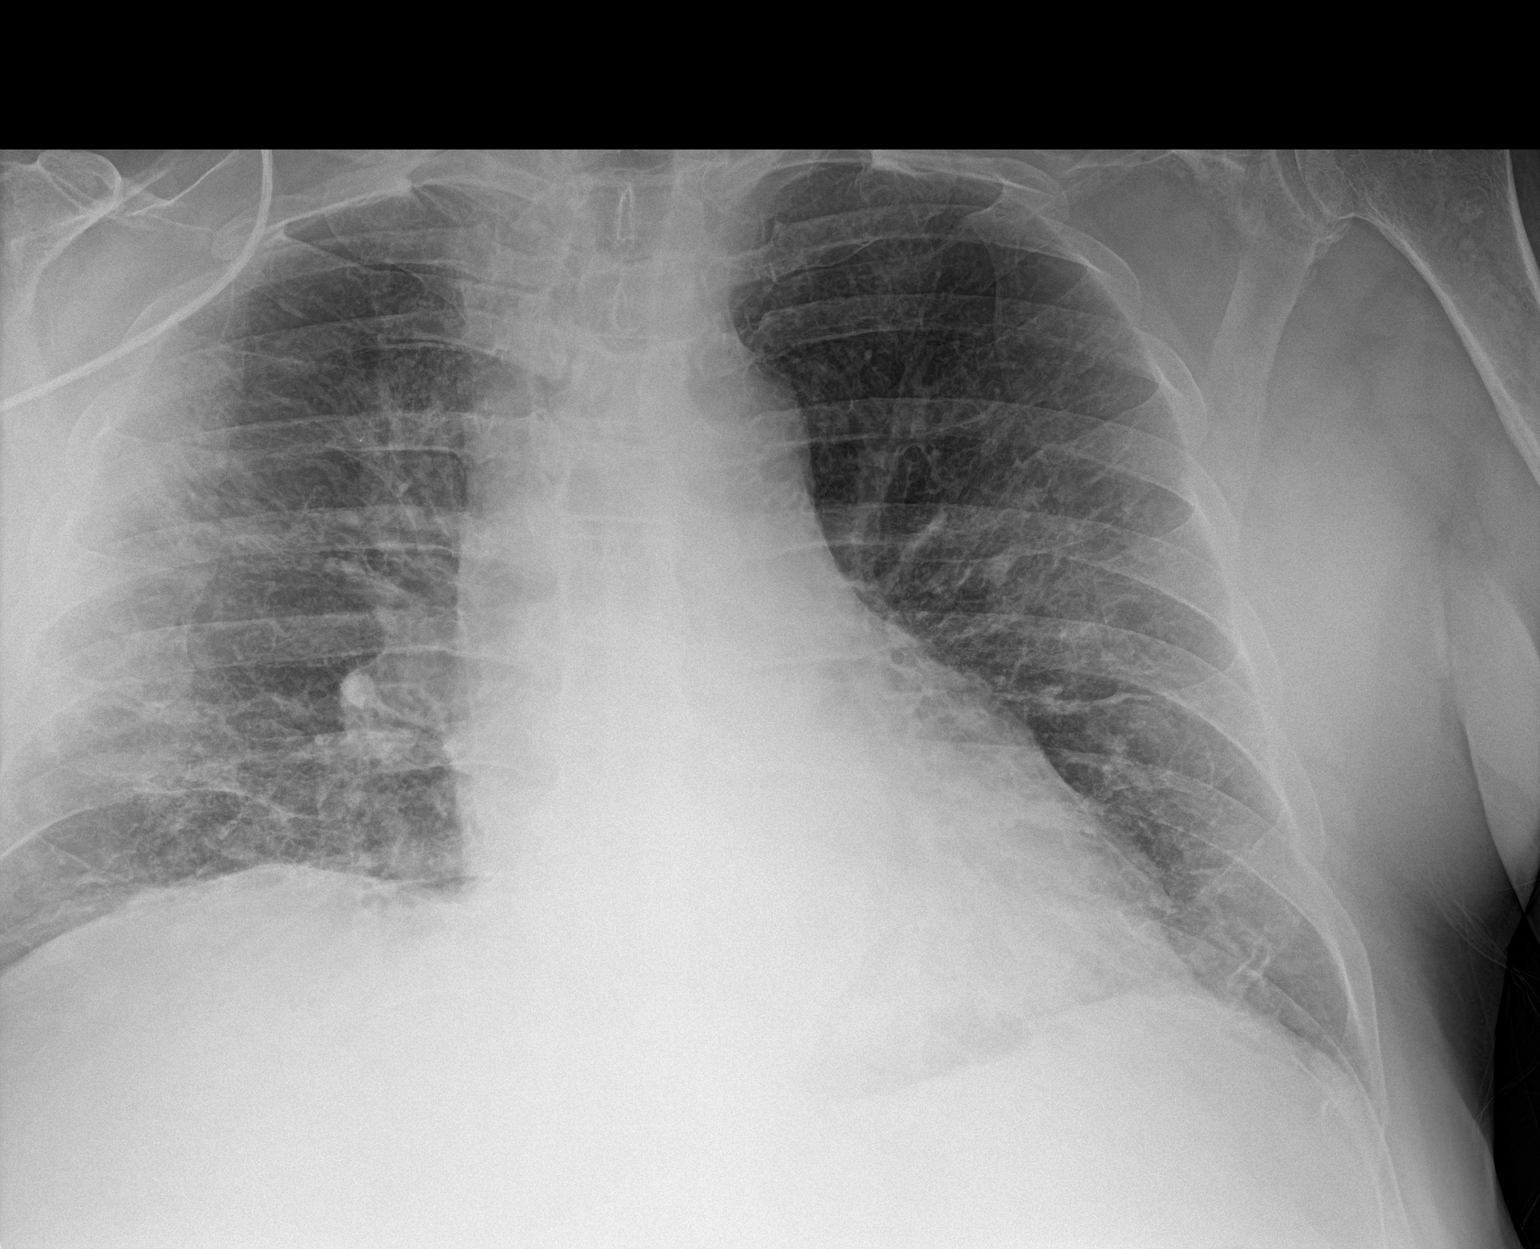

[2 of 2 positions shown; findings below may reference images not displayed]

FINDINGS: Normal heart size, mediastinal contours, and pulmonary vascularity.

Patchy interstitial infiltrates in the lungs bilaterally greater on
RIGHT consistent with multifocal pneumonia.

Slightly increased infiltrate in the lateral aspect of the mid to
upper RIGHT lung.

No pleural effusion or pneumothorax.

Bones demineralized.
IMPRESSION: BILATERAL pulmonary infiltrates consistent with multifocal
pneumonia, slightly increased in periphery of RIGHT lung.

## 2021-10-21 DIAGNOSIS — R2689 Other abnormalities of gait and mobility: Secondary | ICD-10-CM | POA: Diagnosis not present

## 2021-10-21 DIAGNOSIS — M6281 Muscle weakness (generalized): Secondary | ICD-10-CM | POA: Diagnosis not present

## 2021-10-22 DIAGNOSIS — Z8679 Personal history of other diseases of the circulatory system: Secondary | ICD-10-CM | POA: Diagnosis not present

## 2021-10-22 DIAGNOSIS — K219 Gastro-esophageal reflux disease without esophagitis: Secondary | ICD-10-CM | POA: Diagnosis not present

## 2021-10-22 DIAGNOSIS — R600 Localized edema: Secondary | ICD-10-CM | POA: Diagnosis not present

## 2021-10-22 DIAGNOSIS — G4700 Insomnia, unspecified: Secondary | ICD-10-CM | POA: Diagnosis not present

## 2021-10-22 DIAGNOSIS — R413 Other amnesia: Secondary | ICD-10-CM | POA: Diagnosis not present

## 2021-10-22 DIAGNOSIS — E785 Hyperlipidemia, unspecified: Secondary | ICD-10-CM | POA: Diagnosis not present

## 2021-10-22 DIAGNOSIS — F03A4 Unspecified dementia, mild, with anxiety: Secondary | ICD-10-CM | POA: Diagnosis not present

## 2021-10-22 DIAGNOSIS — E559 Vitamin D deficiency, unspecified: Secondary | ICD-10-CM | POA: Diagnosis not present

## 2021-10-22 DIAGNOSIS — Z6832 Body mass index (BMI) 32.0-32.9, adult: Secondary | ICD-10-CM | POA: Diagnosis not present

## 2021-10-22 DIAGNOSIS — E039 Hypothyroidism, unspecified: Secondary | ICD-10-CM | POA: Diagnosis not present

## 2021-10-22 DIAGNOSIS — Z79899 Other long term (current) drug therapy: Secondary | ICD-10-CM | POA: Diagnosis not present

## 2021-11-07 DIAGNOSIS — E785 Hyperlipidemia, unspecified: Secondary | ICD-10-CM | POA: Diagnosis not present

## 2021-11-07 DIAGNOSIS — K219 Gastro-esophageal reflux disease without esophagitis: Secondary | ICD-10-CM | POA: Diagnosis not present

## 2021-11-18 DIAGNOSIS — K219 Gastro-esophageal reflux disease without esophagitis: Secondary | ICD-10-CM | POA: Diagnosis not present

## 2021-11-18 DIAGNOSIS — R1310 Dysphagia, unspecified: Secondary | ICD-10-CM | POA: Diagnosis not present

## 2021-11-18 DIAGNOSIS — K5790 Diverticulosis of intestine, part unspecified, without perforation or abscess without bleeding: Secondary | ICD-10-CM | POA: Diagnosis not present

## 2021-11-18 DIAGNOSIS — K5900 Constipation, unspecified: Secondary | ICD-10-CM | POA: Diagnosis not present

## 2021-11-18 DIAGNOSIS — K649 Unspecified hemorrhoids: Secondary | ICD-10-CM | POA: Diagnosis not present

## 2022-01-15 DIAGNOSIS — Z86010 Personal history of colonic polyps: Secondary | ICD-10-CM | POA: Diagnosis not present

## 2022-01-15 DIAGNOSIS — K2210 Ulcer of esophagus without bleeding: Secondary | ICD-10-CM | POA: Diagnosis not present

## 2022-01-15 DIAGNOSIS — R1013 Epigastric pain: Secondary | ICD-10-CM | POA: Diagnosis not present

## 2022-01-15 DIAGNOSIS — J45909 Unspecified asthma, uncomplicated: Secondary | ICD-10-CM | POA: Diagnosis not present

## 2022-01-15 DIAGNOSIS — K5730 Diverticulosis of large intestine without perforation or abscess without bleeding: Secondary | ICD-10-CM | POA: Diagnosis not present

## 2022-01-15 DIAGNOSIS — Z1211 Encounter for screening for malignant neoplasm of colon: Secondary | ICD-10-CM | POA: Diagnosis not present

## 2022-01-15 DIAGNOSIS — I10 Essential (primary) hypertension: Secondary | ICD-10-CM | POA: Diagnosis not present

## 2022-01-15 DIAGNOSIS — R1310 Dysphagia, unspecified: Secondary | ICD-10-CM | POA: Diagnosis not present

## 2022-01-15 DIAGNOSIS — D122 Benign neoplasm of ascending colon: Secondary | ICD-10-CM | POA: Diagnosis not present

## 2022-01-15 DIAGNOSIS — Z79899 Other long term (current) drug therapy: Secondary | ICD-10-CM | POA: Diagnosis not present

## 2022-01-15 DIAGNOSIS — K635 Polyp of colon: Secondary | ICD-10-CM | POA: Diagnosis not present

## 2022-01-15 DIAGNOSIS — Z87891 Personal history of nicotine dependence: Secondary | ICD-10-CM | POA: Diagnosis not present

## 2022-01-15 DIAGNOSIS — D126 Benign neoplasm of colon, unspecified: Secondary | ICD-10-CM | POA: Diagnosis not present

## 2022-01-15 DIAGNOSIS — K219 Gastro-esophageal reflux disease without esophagitis: Secondary | ICD-10-CM | POA: Diagnosis not present

## 2022-01-15 DIAGNOSIS — K648 Other hemorrhoids: Secondary | ICD-10-CM | POA: Diagnosis not present

## 2022-01-15 DIAGNOSIS — E039 Hypothyroidism, unspecified: Secondary | ICD-10-CM | POA: Diagnosis not present

## 2022-02-03 ENCOUNTER — Other Ambulatory Visit: Payer: Self-pay

## 2022-02-10 ENCOUNTER — Ambulatory Visit: Payer: Medicare HMO | Attending: Cardiology | Admitting: Cardiology

## 2022-02-10 ENCOUNTER — Encounter: Payer: Self-pay | Admitting: Cardiology

## 2022-02-10 VITALS — BP 128/66 | HR 74 | Ht 74.0 in | Wt 270.0 lb

## 2022-02-10 DIAGNOSIS — I2510 Atherosclerotic heart disease of native coronary artery without angina pectoris: Secondary | ICD-10-CM | POA: Diagnosis not present

## 2022-02-10 DIAGNOSIS — I700 Atherosclerosis of aorta: Secondary | ICD-10-CM

## 2022-02-10 DIAGNOSIS — I428 Other cardiomyopathies: Secondary | ICD-10-CM

## 2022-02-10 DIAGNOSIS — I10 Essential (primary) hypertension: Secondary | ICD-10-CM | POA: Diagnosis not present

## 2022-02-10 DIAGNOSIS — I2584 Coronary atherosclerosis due to calcified coronary lesion: Secondary | ICD-10-CM

## 2022-02-10 DIAGNOSIS — E669 Obesity, unspecified: Secondary | ICD-10-CM

## 2022-02-10 DIAGNOSIS — I7121 Aneurysm of the ascending aorta, without rupture: Secondary | ICD-10-CM

## 2022-02-10 DIAGNOSIS — I77810 Thoracic aortic ectasia: Secondary | ICD-10-CM | POA: Diagnosis not present

## 2022-02-10 NOTE — Patient Instructions (Addendum)
Medication Instructions:  Your physician recommends that you continue on your current medications as directed. Please refer to the Current Medication list given to you today.  *If you need a refill on your cardiac medications before your next appointment, please call your pharmacy*   Lab Work: None ordered If you have labs (blood work) drawn today and your tests are completely normal, you will receive your results only by: [place] (if you have MyChart) OR A paper copy in the mail If you have any lab test that is abnormal or we need to change your treatment, we will call you to review the results.   Testing/Procedures: Non-Cardiac CT scanning, (CAT scanning), is a noninvasive, special x-ray that produces cross-sectional images of the body using x-rays and a computer. CT scans help physicians diagnose and treat medical conditions. For some CT exams, a contrast material is used to enhance visibility in the area of the body being studied. CT scans provide greater clarity and reveal more details than regular x-ray exams.  This will be done at [facility]. Once approved we will call you with the okay to schedule. [phone] option 7   Follow-Up: At [facility], you and your health needs are our priority.  As part of our continuing mission to provide you with exceptional heart care, we have created designated Provider Care Teams.  These Care Teams include your primary Cardiologist (physician) and Advanced Practice Providers (APPs -  Physician Assistants and Nurse Practitioners) who all work together to provide you with the care you need, when you need it.  We recommend signing up for the patient portal called "MyChart".  Sign up information is provided on this After Visit Summary.  MyChart is used to connect with patients for Virtual Visits (Telemedicine).  Patients are able to view lab/test results, encounter notes, upcoming appointments, etc.  Non-urgent messages can be sent  to your provider as well.   To learn more about what you can do with MyChart, go to [email or web address].    Your next appointment:   9 month(s)  The format for your next appointment:   In Person  Provider:   Jyl Heinz, MD    Other Instructions none  Important Information About Sugar

## 2022-02-10 NOTE — Progress Notes (Signed)
Cardiology Office Note:    Date:  02/10/2022   ID:  Johnny Compton, DOB 07-26-1940, MRN [id number]  PCP:  [name], MD  Cardiologist:  Jenean Lindau, MD   Referring MD: [name], MD    ASSESSMENT:    1. Ascending aorta dilatation (HCC)   2. Other cardiomyopathy ([place])   3. Coronary artery calcification   4. Essential hypertension   5. Thoracic aorta atherosclerosis (HCC)   6. Obesity (BMI 30-39.9)    PLAN:    In order of problems listed above:  Coronary artery calcification: Secondary prevention stressed with the patient.  Importance of compliance with diet medication stressed any vocalized understanding. Ascending aortic aneurysm: He was supposed to get a CT scan but he did not keep his appointment.  I told him the importance of getting this test done as scheduled and he vocalized understanding and promises to keep his appointment this time. Essential hypertension: Blood pressure is stable and diet was emphasized. Mixed dyslipidemia: He mentions to me that he will be seeing his doctor in the next 2 weeks and we will get complete blood work and send it to [place].  Goal LDL must be less than 70. Obesity: Weight reduction stressed.  Diet emphasized.  Importance of regular exercise stressed and he promises to do better. Cardiomyopathy: Mildly depressed ejection fraction.  He is not keen on any aggressive evaluation or treatment for this and I respect his wishes.  Will follow-up with an echocardiogram. Patient will be seen in follow-up appointment in 9 months or earlier if the patient has any concerns    Medication Adjustments/Labs and Tests Ordered: Current medicines are reviewed at length with the patient today.  Concerns regarding medicines are outlined above.  No orders of the defined types were placed in this encounter.  No orders of the defined types were placed in this encounter.    No chief complaint on file.    History of Present Illness:    Johnny Compton is  a 82 y.o. male.  Patient has past medical history of mild cardiomyopathy, ascending aortic dilatation, essential hypertension, coronary artery calcification, dyslipidemia and history of dementia.  He denies any problems at this time and takes care of activities of daily living.  No chest pain orthopnea or PND.  He leads a sedentary lifestyle.  At the time of my evaluation, the patient is alert awake oriented and in no distress.  Past Medical History:  Diagnosis Date   Acute hypoxemic respiratory failure due to severe acute respiratory syndrome coronavirus 2 (SARS-CoV-2) disease ([place]) 03/04/2019   Allergic rhinosinusitis    Ascending aorta dilatation (HCC) 05/25/2019   Back pain 03/08/2019   BMI 33.0-33.9,adult    Bronchial asthma    Cardiomyopathy ([place]) 05/25/2019   Chest pain    Constipation, chronic    COPD (chronic obstructive pulmonary disease) (HCC)    Coronary artery calcification 04/13/2021   Dementia ([place])    Dyslipidemia    Essential hypertension    Family history of diabetes mellitus    Fatigue    GERD (gastroesophageal reflux disease)    Hyperlipemia    Hypothyroid 03/08/2019   Insomnia    Knee pain, bilateral    Mild depression    Mild memory disturbance    Obesity (BMI 30-39.9)    Palpitations 03/23/2019   Pedal edema    Pneumonia due to COVID-19 virus 03/08/2019   Preoperative cardiovascular examination 11/13/2019   Thoracic aorta atherosclerosis [facility])    Thoracic  aortic aneurysm without rupture (HCC)    Vitamin D deficiency     Past Surgical History:  Procedure Laterality Date   NO PAST SURGERIES      Current Medications: Current Meds  Medication Sig   acetaminophen (TYLENOL) 325 MG tablet Take 2 tablets (650 mg total) by mouth every 6 (six) hours as needed for moderate pain (back pain).   atorvastatin (LIPITOR) 10 MG tablet Take 1 tablet (10 mg total) by mouth daily.   B Complex Vitamins (B COMPLEX 1 PO) Take 1 tablet by mouth daily.   celecoxib  (CELEBREX) 200 MG capsule Take 200 mg by mouth daily.   cetirizine (ZYRTEC) 10 MG tablet Take 10 mg by mouth daily.   donepezil (ARICEPT) 10 MG tablet Take 10 mg by mouth at bedtime.   ergocalciferol (VITAMIN D2) 1.25 MG (50000 UT) capsule Take 50,000 Units by mouth every Wednesday.    furosemide (LASIX) 20 MG tablet Take 20 mg by mouth as needed for edema or fluid.   gabapentin (NEURONTIN) 100 MG capsule Take 100 mg by mouth 3 (three) times daily.   levothyroxine (SYNTHROID) 125 MCG tablet Take 125 mcg by mouth daily before breakfast.   memantine (NAMENDA) 10 MG tablet Take 10 mg by mouth at bedtime.    montelukast (SINGULAIR) 10 MG tablet Take 10 mg by mouth daily.    Multiple Vitamin (MULTIVITAMIN PO) Take 1 tablet by mouth daily.   nitroGLYCERIN (NITROSTAT) 0.4 MG SL tablet Place 0.4 mg under the tongue every 5 (five) minutes as needed for chest pain.   omeprazole (PRILOSEC) 40 MG capsule Take 40 mg by mouth daily.   tamsulosin (FLOMAX) 0.4 MG CAPS capsule Take 0.4 mg by mouth daily.   tiZANidine (ZANAFLEX) 2 MG tablet Take 2 mg by mouth 2 (two) times daily.     Allergies:   Patient has no known allergies.   Social History   Socioeconomic History   Marital status: Married    Spouse name: Sas[place]wa   Number of children: 2   Years of education: Not on file   Highest education level: Not on file  Occupational History   Occupation: retired  Tobacco Use   Smoking status: Former   Smokeless tobacco: Never  Scientific laboratory technician Use: Never used  Substance and Sexual Activity   Alcohol use: Not Currently   Drug use: Never   Sexual activity: Not Currently  Other Topics Concern   Not on file  Social History Narrative   Not on file   Social Determinants of Health   Financial Resource Strain: Not on file  Food Insecurity: Not on file  Transportation Needs: Not on file  Physical Activity: Not on file  Stress: Not on file  Social Connections: Not on file     Family History: The  patient's family history includes Cancer in his brother, brother, sister, and sister; Diabetes in his sister and sister.  ROS:   Please see the history of present illness.    All other systems reviewed and are negative.  EKGs/Labs/Other Studies Reviewed:    The following studies were reviewed today: I discussed my findings with the patient at length.   Recent Labs: No results found for requested labs within last 365 days.  Recent Lipid Panel    Component Value Date/Time   CHOL 153 05/25/2019 0924   TRIG 93 05/25/2019 0924   HDL 35 (L) 05/25/2019 0924   CHOLHDL 4.4 05/25/2019 0924   LDLCALC 100 (H) 05/25/2019  0924    Physical Exam:    VS:  BP 128/66   Pulse 74   Ht '6\' 2"'$  (1.88 m)   Wt 270 lb (122.5 kg)   SpO2 95%   BMI 34.67 kg/m     Wt Readings from Last 3 Encounters:  02/10/22 270 lb (122.5 kg)  04/13/21 267 lb 6.4 oz (121.3 kg)  10/02/20 277 lb (125.6 kg)     GEN: Patient is in no acute distress HEENT: Normal NECK: No JVD; No carotid bruits LYMPHATICS: No lymphadenopathy CARDIAC: Hear sounds regular, 2/6 systolic murmur at the apex. RESPIRATORY:  Clear to auscultation without rales, wheezing or rhonchi  ABDOMEN: Soft, non-tender, non-distended MUSCULOSKELETAL:  No edema; No deformity  SKIN: Warm and dry NEUROLOGIC:  Alert and oriented x 3 PSYCHIATRIC:  Normal affect   Signed, Jenean Lindau, MD  02/10/2022 3:37 PM    [facility]

## 2022-02-10 NOTE — Addendum Note (Signed)
Addended by: Truddie Hidden on: 02/10/2022 04:36 PM   Modules accepted: Orders

## 2022-02-23 ENCOUNTER — Telehealth: Payer: Self-pay | Admitting: Cardiology

## 2022-02-23 DIAGNOSIS — Z8679 Personal history of other diseases of the circulatory system: Secondary | ICD-10-CM | POA: Diagnosis not present

## 2022-02-23 DIAGNOSIS — E538 Deficiency of other specified B group vitamins: Secondary | ICD-10-CM | POA: Diagnosis not present

## 2022-02-23 DIAGNOSIS — E039 Hypothyroidism, unspecified: Secondary | ICD-10-CM | POA: Diagnosis not present

## 2022-02-23 DIAGNOSIS — E559 Vitamin D deficiency, unspecified: Secondary | ICD-10-CM | POA: Diagnosis not present

## 2022-02-23 DIAGNOSIS — E785 Hyperlipidemia, unspecified: Secondary | ICD-10-CM | POA: Diagnosis not present

## 2022-02-23 DIAGNOSIS — Z79899 Other long term (current) drug therapy: Secondary | ICD-10-CM | POA: Diagnosis not present

## 2022-02-23 DIAGNOSIS — R413 Other amnesia: Secondary | ICD-10-CM | POA: Diagnosis not present

## 2022-02-23 DIAGNOSIS — K219 Gastro-esophageal reflux disease without esophagitis: Secondary | ICD-10-CM | POA: Diagnosis not present

## 2022-02-23 DIAGNOSIS — G4700 Insomnia, unspecified: Secondary | ICD-10-CM | POA: Diagnosis not present

## 2022-02-23 DIAGNOSIS — R600 Localized edema: Secondary | ICD-10-CM | POA: Diagnosis not present

## 2022-02-23 NOTE — Telephone Encounter (Signed)
Called patient to make him aware of his appointment for a Chest CT at [facility] on 02/25/22 at 8:30 AM- patient needs to arrive at the outpatient center at 8:00 AM. If this needs to be rescheduled the patient can call [phone] option [phone].

## 2022-02-25 DIAGNOSIS — I7121 Aneurysm of the ascending aorta, without rupture: Secondary | ICD-10-CM | POA: Diagnosis not present

## 2022-02-25 DIAGNOSIS — I712 Thoracic aortic aneurysm, without rupture, unspecified: Secondary | ICD-10-CM | POA: Diagnosis not present

## 2022-02-25 DIAGNOSIS — R918 Other nonspecific abnormal finding of lung field: Secondary | ICD-10-CM | POA: Diagnosis not present

## 2022-03-04 ENCOUNTER — Telehealth: Payer: Self-pay

## 2022-03-04 NOTE — Telephone Encounter (Signed)
Left message to callback. Stable aorta. FU with PCP regarding lung nodules that were seen on last CT.

## 2022-03-05 ENCOUNTER — Telehealth: Payer: Self-pay | Admitting: Cardiology

## 2022-03-05 NOTE — Telephone Encounter (Signed)
Truddie Hidden, RN      03/04/22  4:20 PM Note Left message to callback. Stable aorta. FU with PCP regarding lung nodules that were seen on last CT.        The patient has been notified of the result and verbalized understanding.  All questions (if any) were answered.  Pt states he will get in with his PCP sometime soon, to follow-up on lung nodules that were seen on last CT, as indicated above by Truddie Hidden RN.  Pt verbalized understanding and agrees with this plan.  Nuala Alpha, LPN 7/47/1855 0:15 AM

## 2022-03-05 NOTE — Telephone Encounter (Signed)
Follow Up:       Patient is returning Shonda's call from yesterday, concerning his results.

## 2022-05-21 DIAGNOSIS — K219 Gastro-esophageal reflux disease without esophagitis: Secondary | ICD-10-CM | POA: Diagnosis not present

## 2022-05-21 DIAGNOSIS — F03A4 Unspecified dementia, mild, with anxiety: Secondary | ICD-10-CM | POA: Diagnosis not present

## 2022-05-21 DIAGNOSIS — E039 Hypothyroidism, unspecified: Secondary | ICD-10-CM | POA: Diagnosis not present

## 2022-05-21 DIAGNOSIS — E785 Hyperlipidemia, unspecified: Secondary | ICD-10-CM | POA: Diagnosis not present

## 2022-05-21 DIAGNOSIS — Z6832 Body mass index (BMI) 32.0-32.9, adult: Secondary | ICD-10-CM | POA: Diagnosis not present

## 2022-05-21 DIAGNOSIS — R600 Localized edema: Secondary | ICD-10-CM | POA: Diagnosis not present

## 2022-05-21 DIAGNOSIS — Z8679 Personal history of other diseases of the circulatory system: Secondary | ICD-10-CM | POA: Diagnosis not present

## 2022-05-21 DIAGNOSIS — E559 Vitamin D deficiency, unspecified: Secondary | ICD-10-CM | POA: Diagnosis not present

## 2022-06-16 ENCOUNTER — Other Ambulatory Visit: Payer: Self-pay | Admitting: Cardiology

## 2022-08-09 DIAGNOSIS — Z961 Presence of intraocular lens: Secondary | ICD-10-CM | POA: Diagnosis not present

## 2022-08-25 DIAGNOSIS — K219 Gastro-esophageal reflux disease without esophagitis: Secondary | ICD-10-CM | POA: Diagnosis not present

## 2022-08-25 DIAGNOSIS — G4700 Insomnia, unspecified: Secondary | ICD-10-CM | POA: Diagnosis not present

## 2022-08-25 DIAGNOSIS — E559 Vitamin D deficiency, unspecified: Secondary | ICD-10-CM | POA: Diagnosis not present

## 2022-08-25 DIAGNOSIS — Z8679 Personal history of other diseases of the circulatory system: Secondary | ICD-10-CM | POA: Diagnosis not present

## 2022-08-25 DIAGNOSIS — R600 Localized edema: Secondary | ICD-10-CM | POA: Diagnosis not present

## 2022-08-25 DIAGNOSIS — E785 Hyperlipidemia, unspecified: Secondary | ICD-10-CM | POA: Diagnosis not present

## 2022-08-25 DIAGNOSIS — R413 Other amnesia: Secondary | ICD-10-CM | POA: Diagnosis not present

## 2022-08-25 DIAGNOSIS — E039 Hypothyroidism, unspecified: Secondary | ICD-10-CM | POA: Diagnosis not present

## 2022-08-25 DIAGNOSIS — E538 Deficiency of other specified B group vitamins: Secondary | ICD-10-CM | POA: Diagnosis not present

## 2022-09-23 DIAGNOSIS — Z79899 Other long term (current) drug therapy: Secondary | ICD-10-CM | POA: Diagnosis not present

## 2022-09-23 DIAGNOSIS — B0229 Other postherpetic nervous system involvement: Secondary | ICD-10-CM | POA: Diagnosis not present

## 2022-09-23 DIAGNOSIS — E559 Vitamin D deficiency, unspecified: Secondary | ICD-10-CM | POA: Diagnosis not present

## 2022-09-23 DIAGNOSIS — Z6835 Body mass index (BMI) 35.0-35.9, adult: Secondary | ICD-10-CM | POA: Diagnosis not present

## 2022-09-23 DIAGNOSIS — M222X9 Patellofemoral disorders, unspecified knee: Secondary | ICD-10-CM | POA: Diagnosis not present

## 2022-10-04 DIAGNOSIS — Z0100 Encounter for examination of eyes and vision without abnormal findings: Secondary | ICD-10-CM | POA: Diagnosis not present

## 2022-10-16 DIAGNOSIS — R5383 Other fatigue: Secondary | ICD-10-CM | POA: Diagnosis not present

## 2022-10-16 DIAGNOSIS — R6889 Other general symptoms and signs: Secondary | ICD-10-CM | POA: Diagnosis not present

## 2022-10-16 DIAGNOSIS — N3001 Acute cystitis with hematuria: Secondary | ICD-10-CM | POA: Diagnosis not present

## 2022-10-20 DIAGNOSIS — Z0000 Encounter for general adult medical examination without abnormal findings: Secondary | ICD-10-CM | POA: Diagnosis not present

## 2022-10-20 DIAGNOSIS — Z9181 History of falling: Secondary | ICD-10-CM | POA: Diagnosis not present

## 2022-11-17 ENCOUNTER — Ambulatory Visit: Payer: Medicare HMO | Attending: Cardiology | Admitting: Cardiology

## 2022-11-17 ENCOUNTER — Encounter: Payer: Self-pay | Admitting: Cardiology

## 2022-11-17 ENCOUNTER — Other Ambulatory Visit: Payer: Self-pay | Admitting: Cardiology

## 2022-11-17 VITALS — BP 118/68 | HR 77 | Ht 74.0 in | Wt 276.0 lb

## 2022-11-17 DIAGNOSIS — I77810 Thoracic aortic ectasia: Secondary | ICD-10-CM

## 2022-11-17 DIAGNOSIS — I428 Other cardiomyopathies: Secondary | ICD-10-CM

## 2022-11-17 DIAGNOSIS — I351 Nonrheumatic aortic (valve) insufficiency: Secondary | ICD-10-CM

## 2022-11-17 DIAGNOSIS — R413 Other amnesia: Secondary | ICD-10-CM

## 2022-11-17 DIAGNOSIS — E782 Mixed hyperlipidemia: Secondary | ICD-10-CM

## 2022-11-17 DIAGNOSIS — J449 Chronic obstructive pulmonary disease, unspecified: Secondary | ICD-10-CM | POA: Diagnosis not present

## 2022-11-17 HISTORY — DX: Nonrheumatic aortic (valve) insufficiency: I35.1

## 2022-11-17 NOTE — Patient Instructions (Signed)
Medication Instructions:  Your physician recommends that you continue on your current medications as directed. Please refer to the Current Medication list given to you today.  *If you need a refill on your cardiac medications before your next appointment, please call your pharmacy*   Lab Work: None ordered If you have labs (blood work) drawn today and your tests are completely normal, you will receive your results only by: MyChart Message (if you have MyChart) OR A paper copy in the mail If you have any lab test that is abnormal or we need to change your treatment, we will call you to review the results.   Testing/Procedures: Your physician has requested that you have an echocardiogram. Echocardiography is a painless test that uses sound waves to create images of your heart. It provides your doctor with information about the size and shape of your heart and how well your heart's chambers and valves are working. This procedure takes approximately one hour. There are no restrictions for this procedure. Please do NOT wear cologne, perfume, aftershave, or lotions (deodorant is allowed). Please arrive 15 minutes prior to your appointment time.     Follow-Up: At [facility], you and your health needs are our priority.  As part of our continuing mission to provide you with exceptional heart care, we have created designated Provider Care Teams.  These Care Teams include your primary Cardiologist (physician) and Advanced Practice Providers (APPs -  Physician Assistants and Nurse Practitioners) who all work together to provide you with the care you need, when you need it.  We recommend signing up for the patient portal called "MyChart".  Sign up information is provided on this After Visit Summary.  MyChart is used to connect with patients for Virtual Visits (Telemedicine).  Patients are able to view lab/test results, encounter notes, upcoming appointments, etc.  Non-urgent messages can be sent to  your provider as well.   To learn more about what you can do with MyChart, go to [email or web address].    Your next appointment:   9 month(s)  The format for your next appointment:   In Person  Provider:   Rajan Revankar, MD   Other Instructions Echocardiogram An echocardiogram is a test that uses sound waves (ultrasound) to produce images of the heart. Images from an echocardiogram can provide important information about: Heart size and shape. The size and thickness and movement of your heart's walls. Heart muscle function and strength. Heart valve function or if you have stenosis. Stenosis is when the heart valves are too narrow. If blood is flowing backward through the heart valves (regurgitation). A tumor or infectious growth around the heart valves. Areas of heart muscle that are not working well because of poor blood flow or injury from a heart attack. Aneurysm detection. An aneurysm is a weak or damaged part of an artery wall. The wall bulges out from the normal force of blood pumping through the body. Tell a health care provider about: Any allergies you have. All medicines you are taking, including vitamins, herbs, eye drops, creams, and over-the-counter medicines. Any blood disorders you have. Any surgeries you have had. Any medical conditions you have. Whether you are pregnant or may be pregnant. What are the risks? Generally, this is a safe test. However, problems may occur, including an allergic reaction to dye (contrast) that may be used during the test. What happens before the test? No specific preparation is needed. You may eat and drink normally. What happens during the test? You will   take off your clothes from the waist up and put on a hospital gown. Electrodes or electrocardiogram (ECG)patches may be placed on your chest. The electrodes or patches are then connected to a device that monitors your heart rate and rhythm. You will lie down on a table for an  ultrasound exam. A gel will be applied to your chest to help sound waves pass through your skin. A handheld device, called a transducer, will be pressed against your chest and moved over your heart. The transducer produces sound waves that travel to your heart and bounce back (or "echo" back) to the transducer. These sound waves will be captured in real-time and changed into images of your heart that can be viewed on a video monitor. The images will be recorded on a computer and reviewed by your health care provider. You may be asked to change positions or hold your breath for a short time. This makes it easier to get different views or better views of your heart. In some cases, you may receive contrast through an IV in one of your veins. This can improve the quality of the pictures from your heart. The procedure may vary among health care providers and hospitals.   What can I expect after the test? You may return to your normal, everyday life, including diet, activities, and medicines, unless your health care provider tells you not to do that. Follow these instructions at home: It is up to you to get the results of your test. Ask your health care provider, or the department that is doing the test, when your results will be ready. Keep all follow-up visits. This is important. Summary An echocardiogram is a test that uses sound waves (ultrasound) to produce images of the heart. Images from an echocardiogram can provide important information about the size and shape of your heart, heart muscle function, heart valve function, and other possible heart problems. You do not need to do anything to prepare before this test. You may eat and drink normally. After the echocardiogram is completed, you may return to your normal, everyday life, unless your health care provider tells you not to do that. This information is not intended to replace advice given to you by your health care provider. Make sure you  discuss any questions you have with your health care provider. Document Revised: 09/18/2019 Document Reviewed: 09/18/2019 Elsevier Patient Education  2021 Elsevier Inc.   Important Information About Sugar        

## 2022-11-17 NOTE — Progress Notes (Signed)
Cardiology Office Note:    Date:  11/17/2022   ID:  Johnny Compton, DOB [birth year], MRN [id number]  PCP:  Lucianne Lei, MD  Cardiologist:  Garwin Brothers, MD   Referring MD: Lucianne Lei, MD    ASSESSMENT:    1. Mixed hyperlipidemia   2. Ascending aorta dilatation (HCC)   3. Other cardiomyopathy (HCC)   4. Mild memory disturbance   5. Mild aortic regurgitation    PLAN:    In order of problems listed above:  Primary prevention stressed with the patient.  Importance of compliance with diet medication stressed and patient verbalized standing. Essential hypertension: Blood pressure stable and diet was emphasized. Cardiomyopathy: Mildly depressed ejection fraction.  Medical management.  He does not want to pursue any aggressive measures.  I will do an echocardiogram to assess this for follow-up. Ascending aortic dilatation: CT scan report discussed with the patient at length questions were answered to his satisfaction.  He will get a scan every year.  Noncardiac findings followed by primary care. Mixed dyslipidemia: On lipid-lowering medications followed by primary care. Mild aortic regurgitation: Stable and we will continue to follow. Obesity: Weight reduction stressed diet emphasized and he promises to do better.  Risks of obesity explained. Patient will be seen in follow-up appointment in 6 months or earlier if the patient has any concerns.    Medication Adjustments/Labs and Tests Ordered: Current medicines are reviewed at length with the patient today.  Concerns regarding medicines are outlined above.  Orders Placed This Encounter  Procedures   EKG 12-Lead   No orders of the defined types were placed in this encounter.    No chief complaint on file.    History of Present Illness:    Johnny Compton is a 82 y.o. male.  Patient has past medical history of cardiomyopathy, ascending aortic dilatation.  He denies any problems at this time and takes care of activities of  daily living.  No chest pain orthopnea or PND.  At the time of my evaluation, the patient is alert awake oriented and in no distress.  Past Medical History:  Diagnosis Date   Acute hypoxemic respiratory failure due to severe acute respiratory syndrome coronavirus 2 (SARS-CoV-2) disease (HCC) 03/04/2019   Allergic rhinosinusitis    Ascending aorta dilatation (HCC) 05/25/2019   Back pain 03/08/2019   BMI 33.0-33.9,adult    Bronchial asthma    Cardiomyopathy (HCC) 05/25/2019   Chest pain    Constipation, chronic    COPD (chronic obstructive pulmonary disease) (HCC)    Coronary artery calcification 04/13/2021   Dementia (HCC)    Dyslipidemia    Essential hypertension    Family history of diabetes mellitus    Fatigue    GERD (gastroesophageal reflux disease)    Hyperlipemia    Hypothyroid 03/08/2019   Insomnia    Knee pain, bilateral    Mild depression    Mild memory disturbance    Obesity (BMI 30-39.9)    Palpitations 03/23/2019   Pedal edema    Pneumonia due to COVID-19 virus 03/08/2019   Preoperative cardiovascular examination 11/13/2019   Thoracic aorta atherosclerosis (HCC)    Thoracic aortic aneurysm without rupture (HCC)    Vitamin D deficiency     Past Surgical History:  Procedure Laterality Date   NO PAST SURGERIES      Current Medications: Current Meds  Medication Sig   acetaminophen (TYLENOL) 325 MG tablet Take 2 tablets (650 mg total) by mouth every 6 (six) hours  as needed for moderate pain (back pain).   amitriptyline (ELAVIL) 100 MG tablet Take 100 mg by mouth at bedtime.   atorvastatin (LIPITOR) 10 MG tablet Take 1 tablet (10 mg total) by mouth daily.   B Complex Vitamins (B COMPLEX 1 PO) Take 1 tablet by mouth daily.   celecoxib (CELEBREX) 200 MG capsule Take 200 mg by mouth daily.   cetirizine (ZYRTEC) 10 MG tablet Take 10 mg by mouth daily.   donepezil (ARICEPT) 10 MG tablet Take 10 mg by mouth at bedtime.   ergocalciferol (VITAMIN D2) 1.25 MG (50000  UT) capsule Take 50,000 Units by mouth every Wednesday.    furosemide (LASIX) 20 MG tablet Take 20 mg by mouth as needed for edema or fluid.   gabapentin (NEURONTIN) 100 MG capsule Take 100 mg by mouth 3 (three) times daily.   levothyroxine (SYNTHROID) 137 MCG tablet Take 137 mcg by mouth daily.   memantine (NAMENDA) 10 MG tablet Take 10 mg by mouth at bedtime.    montelukast (SINGULAIR) 10 MG tablet Take 10 mg by mouth daily.    Multiple Vitamin (MULTIVITAMIN PO) Take 1 tablet by mouth daily.   nitroGLYCERIN (NITROSTAT) 0.4 MG SL tablet Place 0.4 mg under the tongue every 5 (five) minutes as needed for chest pain.   omeprazole (PRILOSEC) 40 MG capsule Take 40 mg by mouth daily.   tamsulosin (FLOMAX) 0.4 MG CAPS capsule Take 0.4 mg by mouth daily.   tiZANidine (ZANAFLEX) 2 MG tablet Take 2 mg by mouth 2 (two) times daily.     Allergies:   Patient has no known allergies.   Social History   Socioeconomic History   Marital status: Married    Spouse name: Yolanda   Number of children: 2   Years of education: Not on file   Highest education level: Not on file  Occupational History   Occupation: retired  Tobacco Use   Smoking status: Former   Smokeless tobacco: Never  Advertising account planner   Vaping status: Never Used  Substance and Sexual Activity   Alcohol use: Not Currently   Drug use: Never   Sexual activity: Not Currently  Other Topics Concern   Not on file  Social History Narrative   Not on file   Social Determinants of Health   Financial Resource Strain: Not on file  Food Insecurity: Not on file  Transportation Needs: Not on file  Physical Activity: Not on file  Stress: Not on file  Social Connections: Not on file     Family History: The patient's family history includes Cancer in his brother, brother, sister, and sister; Diabetes in his sister and sister.  ROS:   Please see the history of present illness.    All other systems reviewed and are negative.  EKGs/Labs/Other  Studies Reviewed:    The following studies were reviewed today: .Marland KitchenEKG Interpretation Date/Time:  Wednesday November 17 2022 13:36:45 EDT Ventricular Rate:  77 PR Interval:  218 QRS Duration:  108 QT Interval:  400 QTC Calculation: 452 R Axis:   71  Text Interpretation: Sinus rhythm with sinus arrhythmia with 1st degree A-V block Inferior infarct , age undetermined Anterior infarct , age undetermined Abnormal ECG No previous ECGs available Confirmed by Belva Crome [phone]) on 11/17/2022 1:47:15 PM     Recent Labs: No results found for requested labs within last 365 days.  Recent Lipid Panel    Component Value Date/Time   CHOL 153 05/25/2019 0924   TRIG 93 05/25/2019 0924  HDL 35 (L) 05/25/2019 0924   CHOLHDL 4.4 05/25/2019 0924   LDLCALC 100 (H) 05/25/2019 0924    Physical Exam:    VS:  BP 118/68   Pulse 77   Ht 6\' 2"  (1.88 m)   Wt 276 lb (125.2 kg)   SpO2 96%   BMI 35.44 kg/m     Wt Readings from Last 3 Encounters:  11/17/22 276 lb (125.2 kg)  02/10/22 270 lb (122.5 kg)  04/13/21 267 lb 6.4 oz (121.3 kg)     GEN: Patient is in no acute distress HEENT: Normal NECK: No JVD; No carotid bruits LYMPHATICS: No lymphadenopathy CARDIAC: Hear sounds regular, 2/6 systolic murmur at the apex. RESPIRATORY:  Clear to auscultation without rales, wheezing or rhonchi  ABDOMEN: Soft, non-tender, non-distended MUSCULOSKELETAL:  No edema; No deformity  SKIN: Warm and dry NEUROLOGIC:  Alert and oriented x 3 PSYCHIATRIC:  Normal affect   Signed, Garwin Brothers, MD  11/17/2022 2:00 PM    [facility]

## 2022-11-18 DIAGNOSIS — R339 Retention of urine, unspecified: Secondary | ICD-10-CM | POA: Diagnosis not present

## 2022-11-18 DIAGNOSIS — Z79899 Other long term (current) drug therapy: Secondary | ICD-10-CM | POA: Diagnosis not present

## 2022-11-18 DIAGNOSIS — Z125 Encounter for screening for malignant neoplasm of prostate: Secondary | ICD-10-CM | POA: Diagnosis not present

## 2022-11-18 DIAGNOSIS — N529 Male erectile dysfunction, unspecified: Secondary | ICD-10-CM | POA: Diagnosis not present

## 2022-11-18 DIAGNOSIS — N433 Hydrocele, unspecified: Secondary | ICD-10-CM | POA: Diagnosis not present

## 2022-11-18 DIAGNOSIS — N401 Enlarged prostate with lower urinary tract symptoms: Secondary | ICD-10-CM | POA: Diagnosis not present

## 2022-11-22 DIAGNOSIS — E785 Hyperlipidemia, unspecified: Secondary | ICD-10-CM | POA: Diagnosis not present

## 2022-11-22 DIAGNOSIS — K219 Gastro-esophageal reflux disease without esophagitis: Secondary | ICD-10-CM | POA: Diagnosis not present

## 2022-11-22 DIAGNOSIS — E559 Vitamin D deficiency, unspecified: Secondary | ICD-10-CM | POA: Diagnosis not present

## 2022-11-22 DIAGNOSIS — E039 Hypothyroidism, unspecified: Secondary | ICD-10-CM | POA: Diagnosis not present

## 2022-11-22 DIAGNOSIS — R413 Other amnesia: Secondary | ICD-10-CM | POA: Diagnosis not present

## 2022-11-22 DIAGNOSIS — R600 Localized edema: Secondary | ICD-10-CM | POA: Diagnosis not present

## 2022-11-22 DIAGNOSIS — E538 Deficiency of other specified B group vitamins: Secondary | ICD-10-CM | POA: Diagnosis not present

## 2022-11-22 DIAGNOSIS — Z8679 Personal history of other diseases of the circulatory system: Secondary | ICD-10-CM | POA: Diagnosis not present

## 2022-11-22 DIAGNOSIS — G4700 Insomnia, unspecified: Secondary | ICD-10-CM | POA: Diagnosis not present

## 2022-12-21 ENCOUNTER — Ambulatory Visit: Payer: Medicare HMO | Attending: Cardiology

## 2022-12-21 DIAGNOSIS — I428 Other cardiomyopathies: Secondary | ICD-10-CM | POA: Diagnosis not present

## 2022-12-21 DIAGNOSIS — I351 Nonrheumatic aortic (valve) insufficiency: Secondary | ICD-10-CM | POA: Diagnosis not present

## 2022-12-21 LAB — ECHOCARDIOGRAM COMPLETE
Area-P 1/2: 3.6 cm2
S' Lateral: 3.7 cm

## 2023-02-16 ENCOUNTER — Other Ambulatory Visit: Payer: Self-pay | Admitting: Cardiology

## 2023-02-16 NOTE — Telephone Encounter (Signed)
 Prescription sent to pharmacy.

## 2023-02-26 DIAGNOSIS — M546 Pain in thoracic spine: Secondary | ICD-10-CM | POA: Diagnosis not present

## 2023-02-26 DIAGNOSIS — S0990XA Unspecified injury of head, initial encounter: Secondary | ICD-10-CM | POA: Diagnosis not present

## 2023-02-26 DIAGNOSIS — W19XXXA Unspecified fall, initial encounter: Secondary | ICD-10-CM | POA: Diagnosis not present

## 2023-02-26 DIAGNOSIS — I10 Essential (primary) hypertension: Secondary | ICD-10-CM | POA: Diagnosis not present

## 2023-02-26 DIAGNOSIS — R0781 Pleurodynia: Secondary | ICD-10-CM | POA: Diagnosis not present

## 2023-02-26 DIAGNOSIS — M5134 Other intervertebral disc degeneration, thoracic region: Secondary | ICD-10-CM | POA: Diagnosis not present

## 2023-02-28 DIAGNOSIS — M546 Pain in thoracic spine: Secondary | ICD-10-CM | POA: Diagnosis not present

## 2023-02-28 DIAGNOSIS — R5381 Other malaise: Secondary | ICD-10-CM | POA: Diagnosis not present

## 2023-02-28 DIAGNOSIS — Z6836 Body mass index (BMI) 36.0-36.9, adult: Secondary | ICD-10-CM | POA: Diagnosis not present

## 2023-02-28 DIAGNOSIS — R2689 Other abnormalities of gait and mobility: Secondary | ICD-10-CM | POA: Diagnosis not present

## 2023-02-28 DIAGNOSIS — W19XXXA Unspecified fall, initial encounter: Secondary | ICD-10-CM | POA: Diagnosis not present

## 2023-03-11 DIAGNOSIS — M546 Pain in thoracic spine: Secondary | ICD-10-CM | POA: Diagnosis not present

## 2023-03-11 DIAGNOSIS — E66812 Obesity, class 2: Secondary | ICD-10-CM | POA: Diagnosis not present

## 2023-03-11 DIAGNOSIS — Z6836 Body mass index (BMI) 36.0-36.9, adult: Secondary | ICD-10-CM | POA: Diagnosis not present

## 2023-03-11 DIAGNOSIS — W19XXXA Unspecified fall, initial encounter: Secondary | ICD-10-CM | POA: Diagnosis not present

## 2023-04-05 DIAGNOSIS — Z6835 Body mass index (BMI) 35.0-35.9, adult: Secondary | ICD-10-CM | POA: Diagnosis not present

## 2023-04-05 DIAGNOSIS — E66812 Obesity, class 2: Secondary | ICD-10-CM | POA: Diagnosis not present

## 2023-04-05 DIAGNOSIS — M545 Low back pain, unspecified: Secondary | ICD-10-CM | POA: Diagnosis not present

## 2023-04-05 DIAGNOSIS — Z6836 Body mass index (BMI) 36.0-36.9, adult: Secondary | ICD-10-CM | POA: Diagnosis not present

## 2023-04-12 DIAGNOSIS — M546 Pain in thoracic spine: Secondary | ICD-10-CM | POA: Diagnosis not present

## 2023-04-12 DIAGNOSIS — M47816 Spondylosis without myelopathy or radiculopathy, lumbar region: Secondary | ICD-10-CM | POA: Diagnosis not present

## 2023-04-12 DIAGNOSIS — M51369 Other intervertebral disc degeneration, lumbar region without mention of lumbar back pain or lower extremity pain: Secondary | ICD-10-CM | POA: Diagnosis not present

## 2023-04-12 DIAGNOSIS — M545 Low back pain, unspecified: Secondary | ICD-10-CM | POA: Diagnosis not present

## 2023-05-03 DIAGNOSIS — E559 Vitamin D deficiency, unspecified: Secondary | ICD-10-CM | POA: Diagnosis not present

## 2023-05-03 DIAGNOSIS — Z8679 Personal history of other diseases of the circulatory system: Secondary | ICD-10-CM | POA: Diagnosis not present

## 2023-05-03 DIAGNOSIS — E785 Hyperlipidemia, unspecified: Secondary | ICD-10-CM | POA: Diagnosis not present

## 2023-05-03 DIAGNOSIS — E039 Hypothyroidism, unspecified: Secondary | ICD-10-CM | POA: Diagnosis not present

## 2023-05-03 DIAGNOSIS — Z79899 Other long term (current) drug therapy: Secondary | ICD-10-CM | POA: Diagnosis not present

## 2023-05-03 DIAGNOSIS — R413 Other amnesia: Secondary | ICD-10-CM | POA: Diagnosis not present

## 2023-05-03 DIAGNOSIS — G4700 Insomnia, unspecified: Secondary | ICD-10-CM | POA: Diagnosis not present

## 2023-05-03 DIAGNOSIS — K219 Gastro-esophageal reflux disease without esophagitis: Secondary | ICD-10-CM | POA: Diagnosis not present

## 2023-05-03 DIAGNOSIS — R600 Localized edema: Secondary | ICD-10-CM | POA: Diagnosis not present

## 2023-05-03 DIAGNOSIS — E538 Deficiency of other specified B group vitamins: Secondary | ICD-10-CM | POA: Diagnosis not present

## 2023-07-24 ENCOUNTER — Other Ambulatory Visit: Payer: Self-pay | Admitting: Cardiology

## 2023-09-08 ENCOUNTER — Ambulatory Visit: Attending: Cardiology | Admitting: Cardiology

## 2023-09-08 ENCOUNTER — Encounter: Payer: Self-pay | Admitting: Cardiology

## 2023-09-08 VITALS — BP 130/80 | HR 80 | Ht 74.0 in | Wt 276.2 lb

## 2023-09-08 DIAGNOSIS — I429 Cardiomyopathy, unspecified: Secondary | ICD-10-CM

## 2023-09-08 DIAGNOSIS — I77810 Thoracic aortic ectasia: Secondary | ICD-10-CM

## 2023-09-08 DIAGNOSIS — I351 Nonrheumatic aortic (valve) insufficiency: Secondary | ICD-10-CM

## 2023-09-08 DIAGNOSIS — E782 Mixed hyperlipidemia: Secondary | ICD-10-CM

## 2023-09-08 DIAGNOSIS — I2510 Atherosclerotic heart disease of native coronary artery without angina pectoris: Secondary | ICD-10-CM

## 2023-09-08 DIAGNOSIS — I10 Essential (primary) hypertension: Secondary | ICD-10-CM | POA: Diagnosis not present

## 2023-09-08 DIAGNOSIS — E669 Obesity, unspecified: Secondary | ICD-10-CM | POA: Diagnosis not present

## 2023-09-08 NOTE — Progress Notes (Signed)
 Cardiology Office Note:    Date:  09/08/2023   ID:  Johnny Compton, DOB March 28, 1940, MRN [id number]  PCP:  Johnny Cambric, MD  Cardiologist:  Johnny JONELLE Crape, MD   Referring MD: Johnny Cambric, MD    ASSESSMENT:    1. Ascending aorta dilatation (HCC)   2. Cardiomyopathy, unspecified type (HCC)   3. Coronary artery calcification   4. Essential hypertension   5. Mild aortic regurgitation   6. Obesity (BMI 30-39.9)   7. Mixed hyperlipidemia    PLAN:    In order of problems listed above:  Coronary artery calcification: Secondary prevention stressed with the patient.  Importance of compliance with diet medication stressed and patient verbalized standing.  He was advised to ambulate to the best of his ability. Essential hypertension: Blood pressure is stable and diet was emphasized. Mixed dyslipidemia: On lipid-lowering medications followed by primary care.  Lipids are at goal.  I discussed this from the [facility] sheet. Obesity: Weight reduction stressed risks of obesity explained. Ascending aortic aneurysm: Stable.  Symptoms education was given and will follow-up with CT scan in the next few weeks. Patient will be seen in follow-up appointment in 6 months or earlier if the patient has any concerns.    Medication Adjustments/Labs and Tests Ordered: Current medicines are reviewed at length with the patient today.  Concerns regarding medicines are outlined above.  No orders of the defined types were placed in this encounter.  No orders of the defined types were placed in this encounter.    No chief complaint on file.    History of Present Illness:    Johnny Compton is a 83 y.o. male.  Patient has past medical history of coronary artery disease, ascending aortic aneurysm, mildly depressed ejection fraction, essential hypertension, mild aortic regurgitation and dyslipidemia.  He has some issues with mild dementia and is overweight and needs a sedentary lifestyle.  He denies any chest pain  orthopnea or PND.  At the time of my evaluation, the patient is alert awake oriented and in no distress.  Past Medical History:  Diagnosis Date   Acute hypoxemic respiratory failure due to severe acute respiratory syndrome coronavirus 2 (SARS-CoV-2) disease (HCC) 03/04/2019   Allergic rhinosinusitis    Ascending aorta dilatation (HCC) 05/25/2019   Back pain 03/08/2019   BMI 33.0-33.9,adult    Bronchial asthma    Cardiomyopathy (HCC) 05/25/2019   Chest pain    Constipation, chronic    COPD (chronic obstructive pulmonary disease) (HCC)    Coronary artery calcification 04/13/2021   Dementia (HCC)    Dyslipidemia    Essential hypertension    Family history of diabetes mellitus    Fatigue    GERD (gastroesophageal reflux disease)    Hyperlipemia    Hypothyroid 03/08/2019   Insomnia    Knee pain, bilateral    Mild aortic regurgitation 11/17/2022   Mild depression    Mild memory disturbance    Obesity (BMI 30-39.9)    Palpitations 03/23/2019   Pedal edema    Pneumonia due to COVID-19 virus 03/08/2019   Preoperative cardiovascular examination 11/13/2019   Thoracic aorta atherosclerosis (HCC)    Thoracic aortic aneurysm without rupture (HCC)    Vitamin D deficiency     Past Surgical History:  Procedure Laterality Date   NO PAST SURGERIES      Current Medications: Current Meds  Medication Sig   acetaminophen  (TYLENOL ) 325 MG tablet Take 2 tablets (650 mg total) by mouth every 6 (six)  hours as needed for moderate pain (back pain).   amitriptyline  (ELAVIL ) 100 MG tablet Take 100 mg by mouth at bedtime.   atorvastatin  (LIPITOR) 10 MG tablet TAKE 1 TABLET EVERY DAY   B Complex Vitamins (B COMPLEX 1 PO) Take 1 tablet by mouth daily.   celecoxib (CELEBREX) 200 MG capsule Take 200 mg by mouth daily.   cetirizine (ZYRTEC) 10 MG tablet Take 10 mg by mouth daily.   donepezil  (ARICEPT ) 10 MG tablet Take 10 mg by mouth at bedtime.   ergocalciferol (VITAMIN D2) 1.25 MG (50000 UT)  capsule Take 50,000 Units by mouth every Wednesday.    finasteride (PROSCAR) 5 MG tablet Take 5 mg by mouth daily.   furosemide (LASIX) 40 MG tablet Take 40 mg by mouth as needed for fluid or edema.   gabapentin (NEURONTIN) 100 MG capsule Take 100 mg by mouth 3 (three) times daily.   levothyroxine  (SYNTHROID ) 137 MCG tablet Take 137 mcg by mouth daily.   memantine  (NAMENDA ) 10 MG tablet Take 10 mg by mouth at bedtime.    montelukast (SINGULAIR) 10 MG tablet Take 10 mg by mouth daily.    Multiple Vitamin (MULTIVITAMIN PO) Take 1 tablet by mouth daily.   nitroGLYCERIN (NITROSTAT) 0.4 MG SL tablet Place 0.4 mg under the tongue every 5 (five) minutes as needed for chest pain.   omeprazole (PRILOSEC) 40 MG capsule Take 40 mg by mouth daily.   tamsulosin (FLOMAX) 0.4 MG CAPS capsule Take 0.4 mg by mouth daily.   tiZANidine (ZANAFLEX) 2 MG tablet Take 2 mg by mouth 2 (two) times daily.     Allergies:   Patient has no known allergies.   Social History   Socioeconomic History   Marital status: Married    Spouse name: Johnny Compton   Number of children: 2   Years of education: Not on file   Highest education level: Not on file  Occupational History   Occupation: retired  Tobacco Use   Smoking status: Former   Smokeless tobacco: Never  Advertising account planner   Vaping status: Never Used  Substance and Sexual Activity   Alcohol use: Not Currently   Drug use: Never   Sexual activity: Not Currently  Other Topics Concern   Not on file  Social History Narrative   Not on file   Social Drivers of Health   Financial Resource Strain: Not on file  Food Insecurity: Not on file  Transportation Needs: Not on file  Physical Activity: Not on file  Stress: Not on file  Social Connections: Not on file     Family History: The patient's family history includes Cancer in his brother, brother, sister, and sister; Diabetes in his sister and sister.  ROS:   Please see the history of present illness.    All other  systems reviewed and are negative.  EKGs/Labs/Other Studies Reviewed:    The following studies were reviewed today: .SABRA   I discussed my findings with the patient at length   Recent Labs: No results found for requested labs within last 365 days.  Recent Lipid Panel    Component Value Date/Time   CHOL 153 05/25/2019 0924   TRIG 93 05/25/2019 0924   HDL 35 (L) 05/25/2019 0924   CHOLHDL 4.4 05/25/2019 0924   LDLCALC 100 (H) 05/25/2019 0924    Physical Exam:    VS:  BP 130/80   Pulse 80   Ht 6' 2 (1.88 m)   Wt 276 lb 3.2 oz (125.3 kg)  SpO2 94%   BMI 35.46 kg/m     Wt Readings from Last 3 Encounters:  09/08/23 276 lb 3.2 oz (125.3 kg)  11/17/22 276 lb (125.2 kg)  02/10/22 270 lb (122.5 kg)     GEN: Patient is in no acute distress HEENT: Normal NECK: No JVD; No carotid bruits LYMPHATICS: No lymphadenopathy CARDIAC: Hear sounds regular, 2/6 systolic murmur at the apex. RESPIRATORY:  Clear to auscultation without rales, wheezing or rhonchi  ABDOMEN: Soft, non-tender, non-distended MUSCULOSKELETAL:  No edema; No deformity  SKIN: Warm and dry NEUROLOGIC:  Alert and oriented x 3 PSYCHIATRIC:  Normal affect   Signed, Johnny JONELLE Crape, MD  09/08/2023 3:33 PM    [facility]

## 2023-09-08 NOTE — Patient Instructions (Signed)
 Medication Instructions:  Your physician recommends that you continue on your current medications as directed. Please refer to the Current Medication list given to you today.  *If you need a refill on your cardiac medications before your next appointment, please call your pharmacy*   Lab Work: None ordered If you have labs (blood work) drawn today and your tests are completely normal, you will receive your results only by: MyChart Message (if you have MyChart) OR A paper copy in the mail If you have any lab test that is abnormal or we need to change your treatment, we will call you to review the results.   Testing/Procedures: Non-Cardiac CT scanning, (CAT scanning), is a noninvasive, special x-ray that produces cross-sectional images of the body using x-rays and a computer. CT scans help physicians diagnose and treat medical conditions. For some CT exams, a contrast material is used to enhance visibility in the area of the body being studied. CT scans provide greater clarity and reveal more details than regular x-ray exams.  [facility] [address] [place], [place] [address] [phone]  Follow-Up: At [facility], you and your health needs are our priority.  As part of our continuing mission to provide you with exceptional heart care, we have created designated Provider Care Teams.  These Care Teams include your primary Cardiologist (physician) and Advanced Practice Providers (APPs -  Physician Assistants and Nurse Practitioners) who all work together to provide you with the care you need, when you need it.  We recommend signing up for the patient portal called "MyChart".  Sign up information is provided on this After Visit Summary.  MyChart is used to connect with patients for Virtual Visits (Telemedicine).  Patients are able to view lab/test results, encounter notes, upcoming appointments, etc.  Non-urgent messages can be sent to your  provider as well.   To learn more about what you can do with MyChart, go to [email or web address].    Your next appointment:   9 month(s)  The format for your next appointment:   In Person  Provider:   Belva Crome, MD    Other Instructions none  Important Information About Sugar

## 2023-09-20 DIAGNOSIS — Z79899 Other long term (current) drug therapy: Secondary | ICD-10-CM | POA: Diagnosis not present

## 2023-09-20 DIAGNOSIS — R600 Localized edema: Secondary | ICD-10-CM | POA: Diagnosis not present

## 2023-09-20 DIAGNOSIS — Z8679 Personal history of other diseases of the circulatory system: Secondary | ICD-10-CM | POA: Diagnosis not present

## 2023-09-20 DIAGNOSIS — E039 Hypothyroidism, unspecified: Secondary | ICD-10-CM | POA: Diagnosis not present

## 2023-09-20 DIAGNOSIS — E785 Hyperlipidemia, unspecified: Secondary | ICD-10-CM | POA: Diagnosis not present

## 2023-09-20 DIAGNOSIS — E559 Vitamin D deficiency, unspecified: Secondary | ICD-10-CM | POA: Diagnosis not present

## 2023-09-20 DIAGNOSIS — M159 Polyosteoarthritis, unspecified: Secondary | ICD-10-CM | POA: Diagnosis not present

## 2023-09-20 DIAGNOSIS — G4700 Insomnia, unspecified: Secondary | ICD-10-CM | POA: Diagnosis not present

## 2023-09-20 DIAGNOSIS — E538 Deficiency of other specified B group vitamins: Secondary | ICD-10-CM | POA: Diagnosis not present

## 2023-09-20 DIAGNOSIS — K219 Gastro-esophageal reflux disease without esophagitis: Secondary | ICD-10-CM | POA: Diagnosis not present

## 2023-09-21 ENCOUNTER — Ambulatory Visit (INDEPENDENT_AMBULATORY_CARE_PROVIDER_SITE_OTHER)
Admission: RE | Admit: 2023-09-21 | Discharge: 2023-09-21 | Disposition: A | Discharge status: Home / Self Care | Source: Ambulatory Visit | Attending: Cardiology | Admitting: Cardiology

## 2023-09-21 DIAGNOSIS — I77810 Thoracic aortic ectasia: Secondary | ICD-10-CM | POA: Diagnosis not present

## 2023-09-21 DIAGNOSIS — I700 Atherosclerosis of aorta: Secondary | ICD-10-CM | POA: Diagnosis not present

## 2023-09-29 DIAGNOSIS — K6289 Other specified diseases of anus and rectum: Secondary | ICD-10-CM | POA: Diagnosis not present

## 2023-09-29 DIAGNOSIS — K5909 Other constipation: Secondary | ICD-10-CM | POA: Diagnosis not present

## 2023-09-29 DIAGNOSIS — Z6835 Body mass index (BMI) 35.0-35.9, adult: Secondary | ICD-10-CM | POA: Diagnosis not present

## 2023-10-06 ENCOUNTER — Ambulatory Visit: Payer: Self-pay | Admitting: Cardiology

## 2023-10-06 DIAGNOSIS — M47816 Spondylosis without myelopathy or radiculopathy, lumbar region: Secondary | ICD-10-CM | POA: Diagnosis not present

## 2023-10-06 DIAGNOSIS — M25551 Pain in right hip: Secondary | ICD-10-CM | POA: Diagnosis not present

## 2023-10-06 DIAGNOSIS — K219 Gastro-esophageal reflux disease without esophagitis: Secondary | ICD-10-CM | POA: Diagnosis not present

## 2023-10-06 DIAGNOSIS — M5416 Radiculopathy, lumbar region: Secondary | ICD-10-CM | POA: Diagnosis not present

## 2023-10-07 DIAGNOSIS — M5416 Radiculopathy, lumbar region: Secondary | ICD-10-CM | POA: Diagnosis not present

## 2023-10-07 DIAGNOSIS — M51369 Other intervertebral disc degeneration, lumbar region without mention of lumbar back pain or lower extremity pain: Secondary | ICD-10-CM | POA: Diagnosis not present

## 2023-10-07 DIAGNOSIS — M5431 Sciatica, right side: Secondary | ICD-10-CM | POA: Diagnosis not present

## 2023-10-12 DIAGNOSIS — Z6835 Body mass index (BMI) 35.0-35.9, adult: Secondary | ICD-10-CM | POA: Diagnosis not present

## 2023-10-12 DIAGNOSIS — K5909 Other constipation: Secondary | ICD-10-CM | POA: Diagnosis not present

## 2023-10-12 DIAGNOSIS — Z09 Encounter for follow-up examination after completed treatment for conditions other than malignant neoplasm: Secondary | ICD-10-CM | POA: Diagnosis not present

## 2023-10-12 DIAGNOSIS — M549 Dorsalgia, unspecified: Secondary | ICD-10-CM | POA: Diagnosis not present

## 2023-10-12 DIAGNOSIS — K6289 Other specified diseases of anus and rectum: Secondary | ICD-10-CM | POA: Diagnosis not present

## 2023-10-19 DIAGNOSIS — K5909 Other constipation: Secondary | ICD-10-CM | POA: Diagnosis not present

## 2023-10-19 DIAGNOSIS — K6289 Other specified diseases of anus and rectum: Secondary | ICD-10-CM | POA: Diagnosis not present

## 2023-10-19 DIAGNOSIS — N400 Enlarged prostate without lower urinary tract symptoms: Secondary | ICD-10-CM | POA: Diagnosis not present

## 2023-10-19 DIAGNOSIS — M549 Dorsalgia, unspecified: Secondary | ICD-10-CM | POA: Diagnosis not present

## 2023-10-19 DIAGNOSIS — M159 Polyosteoarthritis, unspecified: Secondary | ICD-10-CM | POA: Diagnosis not present

## 2023-10-31 DIAGNOSIS — Z9181 History of falling: Secondary | ICD-10-CM | POA: Diagnosis not present

## 2023-10-31 DIAGNOSIS — Z0000 Encounter for general adult medical examination without abnormal findings: Secondary | ICD-10-CM | POA: Diagnosis not present

## 2023-11-09 DIAGNOSIS — R2689 Other abnormalities of gait and mobility: Secondary | ICD-10-CM | POA: Diagnosis not present

## 2023-11-09 DIAGNOSIS — M5441 Lumbago with sciatica, right side: Secondary | ICD-10-CM | POA: Diagnosis not present

## 2023-11-09 DIAGNOSIS — G8929 Other chronic pain: Secondary | ICD-10-CM | POA: Diagnosis not present

## 2023-11-16 DIAGNOSIS — R21 Rash and other nonspecific skin eruption: Secondary | ICD-10-CM | POA: Diagnosis not present

## 2023-11-16 DIAGNOSIS — R413 Other amnesia: Secondary | ICD-10-CM | POA: Diagnosis not present

## 2023-11-16 DIAGNOSIS — G5793 Unspecified mononeuropathy of bilateral lower limbs: Secondary | ICD-10-CM | POA: Diagnosis not present

## 2023-11-16 DIAGNOSIS — E559 Vitamin D deficiency, unspecified: Secondary | ICD-10-CM | POA: Diagnosis not present

## 2023-11-16 DIAGNOSIS — E785 Hyperlipidemia, unspecified: Secondary | ICD-10-CM | POA: Diagnosis not present

## 2023-11-16 DIAGNOSIS — K219 Gastro-esophageal reflux disease without esophagitis: Secondary | ICD-10-CM | POA: Diagnosis not present

## 2023-11-16 DIAGNOSIS — S0083XA Contusion of other part of head, initial encounter: Secondary | ICD-10-CM | POA: Diagnosis not present

## 2023-11-16 DIAGNOSIS — S0990XA Unspecified injury of head, initial encounter: Secondary | ICD-10-CM | POA: Diagnosis not present

## 2023-11-16 DIAGNOSIS — Z87891 Personal history of nicotine dependence: Secondary | ICD-10-CM | POA: Diagnosis not present

## 2023-11-16 DIAGNOSIS — S199XXA Unspecified injury of neck, initial encounter: Secondary | ICD-10-CM | POA: Diagnosis not present

## 2023-11-16 DIAGNOSIS — Z79899 Other long term (current) drug therapy: Secondary | ICD-10-CM | POA: Diagnosis not present

## 2023-11-16 DIAGNOSIS — M7989 Other specified soft tissue disorders: Secondary | ICD-10-CM | POA: Diagnosis not present

## 2023-11-16 DIAGNOSIS — W19XXXA Unspecified fall, initial encounter: Secondary | ICD-10-CM | POA: Diagnosis not present

## 2023-11-16 DIAGNOSIS — G250 Essential tremor: Secondary | ICD-10-CM | POA: Diagnosis not present

## 2023-11-16 DIAGNOSIS — Z6836 Body mass index (BMI) 36.0-36.9, adult: Secondary | ICD-10-CM | POA: Diagnosis not present

## 2023-11-23 DIAGNOSIS — L602 Onychogryphosis: Secondary | ICD-10-CM | POA: Diagnosis not present

## 2023-11-23 DIAGNOSIS — E66812 Obesity, class 2: Secondary | ICD-10-CM | POA: Diagnosis not present

## 2023-11-23 DIAGNOSIS — R208 Other disturbances of skin sensation: Secondary | ICD-10-CM | POA: Diagnosis not present

## 2023-11-23 DIAGNOSIS — Z6835 Body mass index (BMI) 35.0-35.9, adult: Secondary | ICD-10-CM | POA: Diagnosis not present

## 2023-11-23 DIAGNOSIS — I739 Peripheral vascular disease, unspecified: Secondary | ICD-10-CM | POA: Diagnosis not present

## 2023-11-23 DIAGNOSIS — M7989 Other specified soft tissue disorders: Secondary | ICD-10-CM | POA: Diagnosis not present

## 2023-12-07 DIAGNOSIS — I739 Peripheral vascular disease, unspecified: Secondary | ICD-10-CM | POA: Diagnosis not present

## 2023-12-08 DIAGNOSIS — R262 Difficulty in walking, not elsewhere classified: Secondary | ICD-10-CM | POA: Diagnosis not present

## 2023-12-15 DIAGNOSIS — I4891 Unspecified atrial fibrillation: Secondary | ICD-10-CM | POA: Diagnosis not present

## 2023-12-15 DIAGNOSIS — I712 Thoracic aortic aneurysm, without rupture, unspecified: Secondary | ICD-10-CM | POA: Diagnosis not present

## 2023-12-15 DIAGNOSIS — E039 Hypothyroidism, unspecified: Secondary | ICD-10-CM | POA: Diagnosis not present

## 2023-12-15 DIAGNOSIS — F02A4 Dementia in other diseases classified elsewhere, mild, with anxiety: Secondary | ICD-10-CM | POA: Diagnosis not present

## 2023-12-15 DIAGNOSIS — I429 Cardiomyopathy, unspecified: Secondary | ICD-10-CM | POA: Diagnosis not present

## 2023-12-15 DIAGNOSIS — G5793 Unspecified mononeuropathy of bilateral lower limbs: Secondary | ICD-10-CM | POA: Diagnosis not present

## 2023-12-15 DIAGNOSIS — I700 Atherosclerosis of aorta: Secondary | ICD-10-CM | POA: Diagnosis not present

## 2023-12-15 DIAGNOSIS — F02A18 Dementia in other diseases classified elsewhere, mild, with other behavioral disturbance: Secondary | ICD-10-CM | POA: Diagnosis not present

## 2023-12-15 DIAGNOSIS — I739 Peripheral vascular disease, unspecified: Secondary | ICD-10-CM | POA: Diagnosis not present

## 2023-12-19 DIAGNOSIS — I4891 Unspecified atrial fibrillation: Secondary | ICD-10-CM | POA: Diagnosis not present

## 2023-12-19 DIAGNOSIS — I712 Thoracic aortic aneurysm, without rupture, unspecified: Secondary | ICD-10-CM | POA: Diagnosis not present

## 2023-12-19 DIAGNOSIS — G5793 Unspecified mononeuropathy of bilateral lower limbs: Secondary | ICD-10-CM | POA: Diagnosis not present

## 2023-12-19 DIAGNOSIS — I700 Atherosclerosis of aorta: Secondary | ICD-10-CM | POA: Diagnosis not present

## 2023-12-19 DIAGNOSIS — I739 Peripheral vascular disease, unspecified: Secondary | ICD-10-CM | POA: Diagnosis not present

## 2023-12-19 DIAGNOSIS — F02A18 Dementia in other diseases classified elsewhere, mild, with other behavioral disturbance: Secondary | ICD-10-CM | POA: Diagnosis not present

## 2023-12-19 DIAGNOSIS — E039 Hypothyroidism, unspecified: Secondary | ICD-10-CM | POA: Diagnosis not present

## 2023-12-19 DIAGNOSIS — I429 Cardiomyopathy, unspecified: Secondary | ICD-10-CM | POA: Diagnosis not present

## 2023-12-22 DIAGNOSIS — G250 Essential tremor: Secondary | ICD-10-CM | POA: Diagnosis not present

## 2023-12-22 DIAGNOSIS — R413 Other amnesia: Secondary | ICD-10-CM | POA: Diagnosis not present

## 2023-12-22 DIAGNOSIS — K219 Gastro-esophageal reflux disease without esophagitis: Secondary | ICD-10-CM | POA: Diagnosis not present

## 2023-12-22 DIAGNOSIS — M7989 Other specified soft tissue disorders: Secondary | ICD-10-CM | POA: Diagnosis not present

## 2023-12-22 DIAGNOSIS — E785 Hyperlipidemia, unspecified: Secondary | ICD-10-CM | POA: Diagnosis not present

## 2023-12-22 DIAGNOSIS — G4700 Insomnia, unspecified: Secondary | ICD-10-CM | POA: Diagnosis not present

## 2023-12-22 DIAGNOSIS — G5793 Unspecified mononeuropathy of bilateral lower limbs: Secondary | ICD-10-CM | POA: Diagnosis not present

## 2023-12-22 DIAGNOSIS — E559 Vitamin D deficiency, unspecified: Secondary | ICD-10-CM | POA: Diagnosis not present

## 2023-12-22 DIAGNOSIS — J309 Allergic rhinitis, unspecified: Secondary | ICD-10-CM | POA: Diagnosis not present

## 2023-12-26 DIAGNOSIS — F02A18 Dementia in other diseases classified elsewhere, mild, with other behavioral disturbance: Secondary | ICD-10-CM | POA: Diagnosis not present

## 2023-12-26 DIAGNOSIS — F02A4 Dementia in other diseases classified elsewhere, mild, with anxiety: Secondary | ICD-10-CM | POA: Diagnosis not present

## 2023-12-26 DIAGNOSIS — E039 Hypothyroidism, unspecified: Secondary | ICD-10-CM | POA: Diagnosis not present

## 2023-12-26 DIAGNOSIS — I739 Peripheral vascular disease, unspecified: Secondary | ICD-10-CM | POA: Diagnosis not present

## 2023-12-26 DIAGNOSIS — I712 Thoracic aortic aneurysm, without rupture, unspecified: Secondary | ICD-10-CM | POA: Diagnosis not present

## 2023-12-26 DIAGNOSIS — I700 Atherosclerosis of aorta: Secondary | ICD-10-CM | POA: Diagnosis not present

## 2023-12-26 DIAGNOSIS — G5793 Unspecified mononeuropathy of bilateral lower limbs: Secondary | ICD-10-CM | POA: Diagnosis not present

## 2023-12-26 DIAGNOSIS — I429 Cardiomyopathy, unspecified: Secondary | ICD-10-CM | POA: Diagnosis not present

## 2023-12-26 DIAGNOSIS — I4891 Unspecified atrial fibrillation: Secondary | ICD-10-CM | POA: Diagnosis not present

## 2023-12-27 DIAGNOSIS — I712 Thoracic aortic aneurysm, without rupture, unspecified: Secondary | ICD-10-CM | POA: Diagnosis not present

## 2023-12-28 DIAGNOSIS — G5793 Unspecified mononeuropathy of bilateral lower limbs: Secondary | ICD-10-CM | POA: Diagnosis not present

## 2023-12-28 DIAGNOSIS — I4891 Unspecified atrial fibrillation: Secondary | ICD-10-CM | POA: Diagnosis not present

## 2023-12-28 DIAGNOSIS — I429 Cardiomyopathy, unspecified: Secondary | ICD-10-CM | POA: Diagnosis not present

## 2023-12-28 DIAGNOSIS — F02A4 Dementia in other diseases classified elsewhere, mild, with anxiety: Secondary | ICD-10-CM | POA: Diagnosis not present

## 2023-12-28 DIAGNOSIS — F02A18 Dementia in other diseases classified elsewhere, mild, with other behavioral disturbance: Secondary | ICD-10-CM | POA: Diagnosis not present

## 2023-12-28 DIAGNOSIS — I700 Atherosclerosis of aorta: Secondary | ICD-10-CM | POA: Diagnosis not present

## 2023-12-28 DIAGNOSIS — E039 Hypothyroidism, unspecified: Secondary | ICD-10-CM | POA: Diagnosis not present

## 2023-12-28 DIAGNOSIS — I739 Peripheral vascular disease, unspecified: Secondary | ICD-10-CM | POA: Diagnosis not present

## 2024-01-02 ENCOUNTER — Telehealth: Payer: Self-pay | Admitting: Cardiology

## 2024-01-02 NOTE — Telephone Encounter (Signed)
  The patient's wife said they received a letter from their insurance stating that the patient's CT done on 09/21/23 was not authorized and will not be covered, and they are being asked to pay $687. She said they called Dr. Roxann office and were told that Dr. Revankar should have submitted the prior authorization for the test.

## 2024-01-03 NOTE — Telephone Encounter (Signed)
 Left vm to return call.

## 2024-01-04 NOTE — Telephone Encounter (Signed)
 Left vm to return call.

## 2024-01-10 DIAGNOSIS — I700 Atherosclerosis of aorta: Secondary | ICD-10-CM | POA: Diagnosis not present

## 2024-01-12 DIAGNOSIS — I429 Cardiomyopathy, unspecified: Secondary | ICD-10-CM | POA: Diagnosis not present

## 2024-01-13 DIAGNOSIS — G5793 Unspecified mononeuropathy of bilateral lower limbs: Secondary | ICD-10-CM | POA: Diagnosis not present

## 2024-01-13 DIAGNOSIS — I712 Thoracic aortic aneurysm, without rupture, unspecified: Secondary | ICD-10-CM | POA: Diagnosis not present

## 2024-01-13 DIAGNOSIS — I429 Cardiomyopathy, unspecified: Secondary | ICD-10-CM | POA: Diagnosis not present

## 2024-01-13 DIAGNOSIS — I700 Atherosclerosis of aorta: Secondary | ICD-10-CM | POA: Diagnosis not present

## 2024-01-13 DIAGNOSIS — F02A18 Dementia in other diseases classified elsewhere, mild, with other behavioral disturbance: Secondary | ICD-10-CM | POA: Diagnosis not present

## 2024-01-13 DIAGNOSIS — I4891 Unspecified atrial fibrillation: Secondary | ICD-10-CM | POA: Diagnosis not present

## 2024-01-13 DIAGNOSIS — I739 Peripheral vascular disease, unspecified: Secondary | ICD-10-CM | POA: Diagnosis not present

## 2024-01-13 DIAGNOSIS — F02A4 Dementia in other diseases classified elsewhere, mild, with anxiety: Secondary | ICD-10-CM | POA: Diagnosis not present

## 2024-01-13 DIAGNOSIS — E039 Hypothyroidism, unspecified: Secondary | ICD-10-CM | POA: Diagnosis not present

## 2024-01-14 DIAGNOSIS — G5793 Unspecified mononeuropathy of bilateral lower limbs: Secondary | ICD-10-CM | POA: Diagnosis not present
